# Patient Record
Sex: Female | Born: 1990 | Race: Asian | Hispanic: No | Marital: Married
Health system: Southern US, Community
[De-identification: ages and names within clinical notes are randomized; demographics above are authoritative.]

## PROBLEM LIST (undated history)

## (undated) ENCOUNTER — Inpatient Hospital Stay (HOSPITAL_COMMUNITY): Payer: Self-pay

## (undated) DIAGNOSIS — E559 Vitamin D deficiency, unspecified: Secondary | ICD-10-CM

## (undated) DIAGNOSIS — Z789 Other specified health status: Secondary | ICD-10-CM

## (undated) DIAGNOSIS — D509 Iron deficiency anemia, unspecified: Secondary | ICD-10-CM

## (undated) HISTORY — DX: Vitamin D deficiency, unspecified: E55.9

## (undated) HISTORY — PX: TONGUE SURGERY: SHX810

## (undated) HISTORY — DX: Iron deficiency anemia, unspecified: D50.9

---
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
## 2009-03-15 ENCOUNTER — Ambulatory Visit: Payer: Self-pay | Admitting: Obstetrics

## 2009-05-19 ENCOUNTER — Ambulatory Visit: Payer: Self-pay | Admitting: Obstetrics and Gynecology

## 2009-05-19 ENCOUNTER — Inpatient Hospital Stay (HOSPITAL_COMMUNITY): Admission: AD | Admit: 2009-05-19 | Discharge: 2009-05-19 | Payer: Self-pay | Admitting: Obstetrics & Gynecology

## 2009-06-08 ENCOUNTER — Inpatient Hospital Stay (HOSPITAL_COMMUNITY): Admission: AD | Admit: 2009-06-08 | Discharge: 2009-06-08 | Payer: Self-pay | Admitting: Obstetrics & Gynecology

## 2009-07-13 ENCOUNTER — Inpatient Hospital Stay (HOSPITAL_COMMUNITY): Admission: AD | Admit: 2009-07-13 | Discharge: 2009-07-13 | Payer: Self-pay | Admitting: Obstetrics & Gynecology

## 2009-07-14 ENCOUNTER — Inpatient Hospital Stay (HOSPITAL_COMMUNITY): Admission: AD | Admit: 2009-07-14 | Discharge: 2009-07-17 | Payer: Self-pay | Admitting: Obstetrics & Gynecology

## 2009-07-19 ENCOUNTER — Inpatient Hospital Stay (HOSPITAL_COMMUNITY): Admission: AD | Admit: 2009-07-19 | Discharge: 2009-07-19 | Payer: Self-pay | Admitting: Obstetrics

## 2010-01-25 ENCOUNTER — Emergency Department (HOSPITAL_COMMUNITY): Admission: EM | Admit: 2010-01-25 | Discharge: 2010-01-25 | Payer: Self-pay | Admitting: Emergency Medicine

## 2010-09-15 LAB — CBC
HCT: 32.9 % — ABNORMAL LOW (ref 36.0–46.0)
Hemoglobin: 10.9 g/dL — ABNORMAL LOW (ref 12.0–15.0)
MCHC: 33.2 g/dL (ref 30.0–36.0)
MCV: 86.5 fL (ref 78.0–100.0)
Platelets: 175 10*3/uL (ref 150–400)
RBC: 4.37 MIL/uL (ref 3.87–5.11)
WBC: 10.6 10*3/uL — ABNORMAL HIGH (ref 4.0–10.5)

## 2010-09-30 LAB — URINALYSIS, ROUTINE W REFLEX MICROSCOPIC
Bilirubin Urine: NEGATIVE
Glucose, UA: 100 mg/dL — AB
Hgb urine dipstick: NEGATIVE
Ketones, ur: NEGATIVE mg/dL
Specific Gravity, Urine: 1.01 (ref 1.005–1.030)

## 2010-09-30 LAB — WET PREP, GENITAL: Clue Cells Wet Prep HPF POC: NONE SEEN

## 2010-09-30 LAB — GC/CHLAMYDIA PROBE AMP, GENITAL
Chlamydia, DNA Probe: NEGATIVE
GC Probe Amp, Genital: NEGATIVE

## 2010-09-30 LAB — FETAL FIBRONECTIN: Fetal Fibronectin: NEGATIVE

## 2010-10-01 LAB — URINALYSIS, ROUTINE W REFLEX MICROSCOPIC
Bilirubin Urine: NEGATIVE
Glucose, UA: 500 mg/dL — AB
Hgb urine dipstick: NEGATIVE
Ketones, ur: NEGATIVE mg/dL
Nitrite: NEGATIVE
Protein, ur: NEGATIVE mg/dL
Specific Gravity, Urine: 1.02 (ref 1.005–1.030)
Urobilinogen, UA: 0.2 mg/dL (ref 0.0–1.0)
pH: 6 (ref 5.0–8.0)

## 2010-10-01 LAB — WET PREP, GENITAL
Clue Cells Wet Prep HPF POC: NONE SEEN
Trich, Wet Prep: NONE SEEN
Yeast Wet Prep HPF POC: NONE SEEN

## 2010-10-01 LAB — URINE MICROSCOPIC-ADD ON

## 2010-10-01 LAB — STREP B DNA PROBE: Strep Group B Ag: POSITIVE

## 2010-10-01 LAB — GC/CHLAMYDIA PROBE AMP, GENITAL: Chlamydia, DNA Probe: NEGATIVE

## 2015-04-19 ENCOUNTER — Inpatient Hospital Stay (HOSPITAL_COMMUNITY)
Admission: AD | Admit: 2015-04-19 | Discharge: 2015-04-19 | Disposition: A | Discharge status: Home / Self Care | Payer: BLUE CROSS/BLUE SHIELD | Source: Ambulatory Visit | Attending: Obstetrics and Gynecology | Admitting: Obstetrics and Gynecology

## 2015-04-19 ENCOUNTER — Encounter (HOSPITAL_COMMUNITY): Payer: Self-pay | Admitting: *Deleted

## 2015-04-19 DIAGNOSIS — O211 Hyperemesis gravidarum with metabolic disturbance: Secondary | ICD-10-CM | POA: Diagnosis present

## 2015-04-19 DIAGNOSIS — Z3A14 14 weeks gestation of pregnancy: Secondary | ICD-10-CM | POA: Diagnosis not present

## 2015-04-19 DIAGNOSIS — O219 Vomiting of pregnancy, unspecified: Secondary | ICD-10-CM

## 2015-04-19 DIAGNOSIS — E860 Dehydration: Secondary | ICD-10-CM

## 2015-04-19 HISTORY — DX: Other specified health status: Z78.9

## 2015-04-19 LAB — URINALYSIS, ROUTINE W REFLEX MICROSCOPIC
Bilirubin Urine: NEGATIVE
Glucose, UA: NEGATIVE mg/dL
Hgb urine dipstick: NEGATIVE
KETONES UR: 40 mg/dL — AB
NITRITE: NEGATIVE
PROTEIN: NEGATIVE mg/dL
Specific Gravity, Urine: >1.03 — ABNORMAL HIGH (ref 1.005–1.030)
UROBILINOGEN UA: 0.2 mg/dL (ref 0.0–1.0)
pH: 6 (ref 5.0–8.0)

## 2015-04-19 LAB — COMPREHENSIVE METABOLIC PANEL
ALK PHOS: 45 U/L (ref 38–126)
ALT: 11 U/L — AB (ref 14–54)
ANION GAP: 6 (ref 5–15)
AST: 17 U/L (ref 15–41)
Albumin: 3.5 g/dL (ref 3.5–5.0)
BUN: 6 mg/dL (ref 6–20)
CALCIUM: 8.8 mg/dL — AB (ref 8.9–10.3)
CHLORIDE: 105 mmol/L (ref 101–111)
CO2: 24 mmol/L (ref 22–32)
CREATININE: 0.44 mg/dL (ref 0.44–1.00)
Glucose, Bld: 88 mg/dL (ref 65–99)
Potassium: 3.3 mmol/L — ABNORMAL LOW (ref 3.5–5.1)
SODIUM: 135 mmol/L (ref 135–145)
Total Bilirubin: 0.4 mg/dL (ref 0.3–1.2)
Total Protein: 7.3 g/dL (ref 6.5–8.1)

## 2015-04-19 LAB — CBC
HCT: 28.3 % — ABNORMAL LOW (ref 36.0–46.0)
Hemoglobin: 9.6 g/dL — ABNORMAL LOW (ref 12.0–15.0)
MCH: 24.2 pg — AB (ref 26.0–34.0)
MCHC: 33.9 g/dL (ref 30.0–36.0)
MCV: 71.5 fL — AB (ref 78.0–100.0)
PLATELETS: 243 10*3/uL (ref 150–400)
RBC: 3.96 MIL/uL (ref 3.87–5.11)
RDW: 18.1 % — ABNORMAL HIGH (ref 11.5–15.5)
WBC: 7 10*3/uL (ref 4.0–10.5)

## 2015-04-19 LAB — URINE MICROSCOPIC-ADD ON

## 2015-04-19 MED ORDER — POTASSIUM CHLORIDE 10 MEQ/100ML IV SOLN
10.0000 meq | Freq: Once | INTRAVENOUS | Status: AC
  Administered 2015-04-19: 10 meq via INTRAVENOUS
  Filled 2015-04-19: qty 100

## 2015-04-19 MED ORDER — PROMETHAZINE HCL 25 MG/ML IJ SOLN
25.0000 mg | Freq: Once | INTRAMUSCULAR | Status: AC
  Administered 2015-04-19: 25 mg via INTRAVENOUS
  Filled 2015-04-19: qty 1

## 2015-04-19 MED ORDER — LACTATED RINGERS IV BOLUS (SEPSIS)
1000.0000 mL | Freq: Once | INTRAVENOUS | Status: AC
  Administered 2015-04-19: 1000 mL via INTRAVENOUS

## 2015-04-19 MED ORDER — DEXTROSE 5 % IN LACTATED RINGERS IV BOLUS
1000.0000 mL | Freq: Once | INTRAVENOUS | Status: AC
  Administered 2015-04-19: 1000 mL via INTRAVENOUS

## 2015-04-19 MED ORDER — PROMETHAZINE HCL 25 MG PO TABS: 12.5000 mg | ORAL_TABLET | Freq: Four times a day (QID) | ORAL | Status: DC | PRN

## 2015-04-19 NOTE — MAU Note (Signed)
Pt reports she has not been able to keep much down for over a week. On diclegis but not really helping.

## 2015-04-19 NOTE — MAU Provider Note (Signed)
Chief Complaint: Emesis During Pregnancy   First Provider Initiated Contact with Patient 04/19/15 1754      SUBJECTIVE HPI: Tina Gonzalez is a 24 y.o. G2P1001 at [redacted]w[redacted]d by LMP who presents to maternity admissions reporting nausea with vomiting 4-5x /day making her unable to keep down food/fluids. The nausea started several weeks ago but has been worse this week.   She denies vaginal bleeding, vaginal itching/burning, urinary symptoms, h/a, dizziness, or fever/chills.     Emesis  This is a recurrent problem. The current episode started 1 to 4 weeks ago. The problem occurs 5 to 10 times per day. The problem has been rapidly worsening. The emesis has an appearance of stomach contents. There has been no fever. Pertinent negatives include no abdominal pain, chest pain, chills, diarrhea, dizziness, fever or headaches. Treatments tried: Diclegis. The treatment provided no relief.    Past Medical History  Diagnosis Date  . Medical history non-contributory    History reviewed. No pertinent past surgical history. Social History   Social History  . Marital Status: Single    Spouse Name: N/A  . Number of Children: N/A  . Years of Education: N/A   Occupational History  . Not on file.   Social History Main Topics  . Smoking status: Never Smoker   . Smokeless tobacco: Not on file  . Alcohol Use: No  . Drug Use: No  . Sexual Activity: Not Currently   Other Topics Concern  . Not on file   Social History Narrative  . No narrative on file   No current facility-administered medications on file prior to encounter.   No current outpatient prescriptions on file prior to encounter.   No Known Allergies  ROS:  Review of Systems  Constitutional: Negative for fever, chills and fatigue.  HENT: Negative for sinus pressure.   Eyes: Negative for photophobia.  Respiratory: Negative for shortness of breath.   Cardiovascular: Negative for chest pain.  Gastrointestinal: Positive for vomiting.  Negative for nausea, abdominal pain, diarrhea and constipation.  Genitourinary: Negative for dysuria, frequency, flank pain, vaginal bleeding, vaginal discharge, difficulty urinating, vaginal pain and pelvic pain.  Musculoskeletal: Negative for neck pain.  Neurological: Negative for dizziness, weakness and headaches.  Psychiatric/Behavioral: Negative.      I have reviewed patient's Past Medical Hx, Surgical Hx, Family Hx, Social Hx, medications and allergies.   Physical Exam   Patient Vitals for the past 24 hrs:  BP Temp Temp src Pulse Resp Height Weight  04/19/15 1640 108/86 mmHg 99.3 F (37.4 C) Oral 99 18 - -  04/19/15 1509 116/72 mmHg 98.2 F (36.8 C) Oral 89 18  (1.651 m) 55.792 kg (123 lb)   Constitutional: Well-developed, well-nourished female in no acute distress.  Cardiovascular: normal rate Respiratory: normal effort GI: Abd soft, non-tender. Pos BS x 4 MS: Extremities nontender, no edema, normal ROM Neurologic: Alert and oriented x 4.  GU: Neg CVAT.   LAB RESULTS Results for orders placed or performed during the hospital encounter of 04/19/15 (from the past 24 hour(s))  Urinalysis, Routine w reflex microscopic (not at [facility])     Status: Abnormal   Collection Time: 04/19/15  3:05 PM  Result Value Ref Range   Color, Urine YELLOW YELLOW   APPearance CLEAR CLEAR   Specific Gravity, Urine >1.030 (H) 1.005 - 1.030   pH 6.0 5.0 - 8.0   Glucose, UA NEGATIVE NEGATIVE mg/dL   Hgb urine dipstick NEGATIVE NEGATIVE   Bilirubin Urine NEGATIVE NEGATIVE  Ketones, ur 40 (A) NEGATIVE mg/dL   Protein, ur NEGATIVE NEGATIVE mg/dL   Urobilinogen, UA 0.2 0.0 - 1.0 mg/dL   Nitrite NEGATIVE NEGATIVE   Leukocytes, UA TRACE (A) NEGATIVE  Urine microscopic-add on     Status: Abnormal   Collection Time: 04/19/15  3:05 PM  Result Value Ref Range   Squamous Epithelial / LPF FEW (A) RARE   WBC, UA 0-2 <3 WBC/hpf   RBC / HPF 0-2 <3 RBC/hpf   Bacteria, UA RARE RARE   Urine-Other  MUCOUS PRESENT   CBC     Status: Abnormal   Collection Time: 04/19/15  5:56 PM  Result Value Ref Range   WBC 7.0 4.0 - 10.5 K/uL   RBC 3.96 3.87 - 5.11 MIL/uL   Hemoglobin 9.6 (L) 12.0 - 15.0 g/dL   HCT 47.828.3 (L) 29.536.0 - 62.146.0 %   MCV 71.5 (L) 78.0 - 100.0 fL   MCH 24.2 (L) 26.0 - 34.0 pg   MCHC 33.9 30.0 - 36.0 g/dL   RDW 30.818.1 (H) 65.711.5 - 84.615.5 %   Platelets 243 150 - 400 K/uL  Comprehensive metabolic panel     Status: Abnormal   Collection Time: 04/19/15  5:56 PM  Result Value Ref Range   Sodium 135 135 - 145 mmol/L   Potassium 3.3 (L) 3.5 - 5.1 mmol/L   Chloride 105 101 - 111 mmol/L   CO2 24 22 - 32 mmol/L   Glucose, Bld 88 65 - 99 mg/dL   BUN 6 6 - 20 mg/dL   Creatinine, Ser 9.620.44 0.44 - 1.00 mg/dL   Calcium 8.8 (L) 8.9 - 10.3 mg/dL   Total Protein 7.3 6.5 - 8.1 g/dL   Albumin 3.5 3.5 - 5.0 g/dL   AST 17 15 - 41 U/L   ALT 11 (L) 14 - 54 U/L   Alkaline Phosphatase 45 38 - 126 U/L   Total Bilirubin 0.4 0.3 - 1.2 mg/dL   GFR calc non Af Amer >60 >60 mL/min   GFR calc Af Amer >60 >60 mL/min   Anion gap 6 5 - 15       MAU Management/MDM: Ordered labs and reviewed results.  Consult Dr Ambrose MantleHenley, reviewed assessment/labs.  Treatments in MAU included D5LR x 1000 ml, LR x 1000 ml, Phenergan 25 mg IV, and KCL x 10 meqs IV.  Pt reports significant improvement in nausea after fluids/Phenergan IV.  Pt stable at time of discharge.  ASSESSMENT 1. Nausea and vomiting during pregnancy prior to [redacted] weeks gestation   2. Mild dehydration     PLAN Discharge home Increase PO fluids Continue Diclegis daily Phenergan 12.5-25 mg PO or PV Q 6 hours  Follow-up Information    Follow up with Bing PlumeHENLEY,THOMAS F, MD.   Specialty:  Obstetrics and Gynecology   Why:  As scheduled   Contact information:   [address], [address] [place] [phone]       [facility] with [facility] [place] [facility].   Why:  As needed for emergencies   Contact  information:   [address] [id number] mc [place] [phone]      Sharen CounterLisa Leftwich-Kirby Certified Nurse-Midwife 04/19/2015  9:15 PM

## 2015-04-19 NOTE — Discharge Instructions (Signed)

## 2015-05-09 ENCOUNTER — Inpatient Hospital Stay (HOSPITAL_COMMUNITY)
Admission: EM | Admit: 2015-05-09 | Discharge: 2015-05-09 | Disposition: A | Discharge status: Home / Self Care | Payer: BLUE CROSS/BLUE SHIELD | Source: Ambulatory Visit | Attending: Obstetrics and Gynecology | Admitting: Obstetrics and Gynecology

## 2015-05-09 ENCOUNTER — Encounter (HOSPITAL_COMMUNITY): Payer: Self-pay

## 2015-05-09 DIAGNOSIS — Z3A17 17 weeks gestation of pregnancy: Secondary | ICD-10-CM | POA: Insufficient documentation

## 2015-05-09 DIAGNOSIS — O211 Hyperemesis gravidarum with metabolic disturbance: Secondary | ICD-10-CM | POA: Diagnosis present

## 2015-05-09 DIAGNOSIS — E860 Dehydration: Secondary | ICD-10-CM | POA: Insufficient documentation

## 2015-05-09 DIAGNOSIS — O210 Mild hyperemesis gravidarum: Secondary | ICD-10-CM | POA: Diagnosis not present

## 2015-05-09 LAB — CBC
HCT: 30.7 % — ABNORMAL LOW (ref 36.0–46.0)
HEMOGLOBIN: 10.2 g/dL — AB (ref 12.0–15.0)
MCH: 23.7 pg — AB (ref 26.0–34.0)
MCHC: 33.2 g/dL (ref 30.0–36.0)
MCV: 71.4 fL — ABNORMAL LOW (ref 78.0–100.0)
PLATELETS: 282 10*3/uL (ref 150–400)
RBC: 4.3 MIL/uL (ref 3.87–5.11)
RDW: 17.9 % — AB (ref 11.5–15.5)
WBC: 9.3 10*3/uL (ref 4.0–10.5)

## 2015-05-09 LAB — URINE MICROSCOPIC-ADD ON

## 2015-05-09 LAB — URINALYSIS, ROUTINE W REFLEX MICROSCOPIC
GLUCOSE, UA: NEGATIVE mg/dL
Hgb urine dipstick: NEGATIVE
Nitrite: NEGATIVE
PH: 6 (ref 5.0–8.0)
Protein, ur: 30 mg/dL — AB
Specific Gravity, Urine: >1.03 — ABNORMAL HIGH (ref 1.005–1.030)
Urobilinogen, UA: 0.2 mg/dL (ref 0.0–1.0)

## 2015-05-09 LAB — COMPREHENSIVE METABOLIC PANEL
ALBUMIN: 3.6 g/dL (ref 3.5–5.0)
ALK PHOS: 68 U/L (ref 38–126)
ALT: 13 U/L — ABNORMAL LOW (ref 14–54)
ANION GAP: 11 (ref 5–15)
AST: 19 U/L (ref 15–41)
BUN: 9 mg/dL (ref 6–20)
CALCIUM: 9.7 mg/dL (ref 8.9–10.3)
CHLORIDE: 104 mmol/L (ref 101–111)
CO2: 19 mmol/L — AB (ref 22–32)
Creatinine, Ser: 0.44 mg/dL (ref 0.44–1.00)
GFR calc non Af Amer: >60 mL/min (ref >60)
GLUCOSE: 104 mg/dL — AB (ref 65–99)
POTASSIUM: 3.6 mmol/L (ref 3.5–5.1)
SODIUM: 134 mmol/L — AB (ref 135–145)
Total Bilirubin: 0.5 mg/dL (ref 0.3–1.2)
Total Protein: 7.6 g/dL (ref 6.5–8.1)

## 2015-05-09 MED ORDER — LACTATED RINGERS IV BOLUS (SEPSIS)
1000.0000 mL | Freq: Once | INTRAVENOUS | Status: AC
  Administered 2015-05-09: 1000 mL via INTRAVENOUS

## 2015-05-09 MED ORDER — ONDANSETRON 4 MG PO TBDP: 8.0000 mg | ORAL_TABLET | Freq: Three times a day (TID) | ORAL | Status: DC | PRN

## 2015-05-09 MED ORDER — PROMETHAZINE HCL 25 MG/ML IJ SOLN
25.0000 mg | Freq: Once | INTRAVENOUS | Status: AC
  Administered 2015-05-09: 25 mg via INTRAVENOUS
  Filled 2015-05-09: qty 1

## 2015-05-09 MED ORDER — PROMETHAZINE HCL 25 MG PO TABS: 12.5000 mg | ORAL_TABLET | Freq: Four times a day (QID) | ORAL | Status: DC | PRN

## 2015-05-09 MED ORDER — PROMETHAZINE HCL 25 MG RE SUPP: 25.0000 mg | Freq: Four times a day (QID) | RECTAL | Status: DC | PRN

## 2015-05-09 MED ORDER — SODIUM CHLORIDE 0.9 % IV SOLN
8.0000 mg | Freq: Once | INTRAVENOUS | Status: DC
  Filled 2015-05-09: qty 4

## 2015-05-09 NOTE — MAU Provider Note (Signed)
Chief Complaint: Emesis During Pregnancy  First Provider Initiated Contact with Patient 05/09/15 1822      SUBJECTIVE HPI: Tina Gonzalez is a 24 y.o. G2P1001 at 2589w2d who presents to Maternity Admissions reporting exacerbation of nausea and vomiting of pregnancy. States she has manually keep anything down for 3 days. Has prescriptions for Zofran and Phenergan, but states the Zofran doesn't help her a much and the Phenergan is too sedating for her to use on a regular basis. Last doses of each were yesterday. She doesn't she Either of them down.   Weight 123 pounds on 04/19/2015.  Location: Mild groin pain that she attributes to frequent vomiting. Quality: Pulling sensation Severity: 2/10 on pain scale Duration: Greater than 1 week Context: With vomiting Timing: Intermittent Modifying factors: Worse with vomiting. Associated signs and symptoms: Negative for fever, chills, diarrhea, sick contacts. Positive for small streaks of blood in emesis.  Past Medical History  Diagnosis Date  . Medical history non-contributory    OB History  Gravida Para Term Preterm AB SAB TAB Ectopic Multiple Living  2 1 1       1     # Outcome Date GA Lbr Len/2nd Weight Sex Delivery Anes PTL Lv  2 Current           1 Term 07/15/09    M Vag-Spont   Y     No past surgical history on file. Social History   Social History  . Marital Status: Single    Spouse Name: N/A  . Number of Children: N/A  . Years of Education: N/A   Occupational History  . Not on file.   Social History Main Topics  . Smoking status: Never Smoker   . Smokeless tobacco: Not on file  . Alcohol Use: No  . Drug Use: No  . Sexual Activity: Not Currently   Other Topics Concern  . Not on file   Social History Narrative  . No narrative on file   No current facility-administered medications on file prior to encounter.   Current Outpatient Prescriptions on File Prior to Encounter  Medication Sig Dispense Refill  .  Doxylamine-Pyridoxine (DICLEGIS) 10-10 MG TBEC Take 1-2 tablets by mouth 3 (three) times daily as needed (for nausea and vomiting).    . Prenatal Vit-Min-FA-Fish Oil ([employer] PRENATAL GUMMY PO) Take 2 each by mouth daily.    . promethazine (PHENERGAN) 25 MG tablet Take 0.5-1 tablets (12.5-25 mg total) by mouth every 6 (six) hours as needed for nausea. 30 tablet 0   No Known Allergies  I have reviewed the past Medical Hx, Surgical Hx, Social Hx, Allergies and Medications.   Review of Systems  Constitutional: Negative for fever and chills.  HENT: Positive for congestion and rhinorrhea.   Gastrointestinal: Positive for nausea, vomiting, abdominal pain and constipation. Negative for diarrhea and blood in stool.  Genitourinary: Negative for dysuria, urgency, frequency, hematuria and vaginal bleeding.  Neurological: Positive for weakness.    OBJECTIVE Patient Vitals for the past 24 hrs:  BP Temp Temp src Pulse Resp Height Weight  05/09/15 1718 121/69 mmHg 98.5 F (36.9 C) Oral 113 16 5\' 5"  (1.651 m) 54.976 kg (121 lb 3.2 oz)   Constitutional: Well-developed, well-nourished female in no acute distress. Actively vomiting. Head: Mucous membranes dry. Lips pealing Cardiovascular: Tachycardia Respiratory: normal rate and effort.  GI: Abd soft, non-tender, gravid appropriate for gestational age.  Neurologic: Alert and oriented x 4.   LAB RESULTS Results for orders placed or performed  during the hospital encounter of 05/09/15 (from the past 24 hour(s))  Urinalysis, Routine w reflex microscopic (not at [facility])     Status: Abnormal   Collection Time: 05/09/15  5:25 PM  Result Value Ref Range   Color, Urine YELLOW YELLOW   APPearance CLEAR CLEAR   Specific Gravity, Urine >1.030 (H) 1.005 - 1.030   pH 6.0 5.0 - 8.0   Glucose, UA NEGATIVE NEGATIVE mg/dL   Hgb urine dipstick NEGATIVE NEGATIVE   Bilirubin Urine SMALL (A) NEGATIVE   Ketones, ur >80 (A) NEGATIVE mg/dL   Protein, ur 30 (A) NEGATIVE  mg/dL   Urobilinogen, UA 0.2 0.0 - 1.0 mg/dL   Nitrite NEGATIVE NEGATIVE   Leukocytes, UA TRACE (A) NEGATIVE  Urine microscopic-add on     Status: Abnormal   Collection Time: 05/09/15  5:25 PM  Result Value Ref Range   Squamous Epithelial / LPF FEW (A) RARE   WBC, UA 0-2 <3 WBC/hpf   RBC / HPF 0-2 <3 RBC/hpf   Urine-Other MUCOUS PRESENT   CBC     Status: Abnormal   Collection Time: 05/09/15  6:40 PM  Result Value Ref Range   WBC 9.3 4.0 - 10.5 K/uL   RBC 4.30 3.87 - 5.11 MIL/uL   Hemoglobin 10.2 (L) 12.0 - 15.0 g/dL   HCT 16.1 (L) 09.6 - 04.5 %   MCV 71.4 (L) 78.0 - 100.0 fL   MCH 23.7 (L) 26.0 - 34.0 pg   MCHC 33.2 30.0 - 36.0 g/dL   RDW 40.9 (H) 81.1 - 91.4 %   Platelets 282 150 - 400 K/uL  Comprehensive metabolic panel     Status: Abnormal   Collection Time: 05/09/15  6:40 PM  Result Value Ref Range   Sodium 134 (L) 135 - 145 mmol/L   Potassium 3.6 3.5 - 5.1 mmol/L   Chloride 104 101 - 111 mmol/L   CO2 19 (L) 22 - 32 mmol/L   Glucose, Bld 104 (H) 65 - 99 mg/dL   BUN 9 6 - 20 mg/dL   Creatinine, Ser 7.82 0.44 - 1.00 mg/dL   Calcium 9.7 8.9 - 95.6 mg/dL   Total Protein 7.6 6.5 - 8.1 g/dL   Albumin 3.6 3.5 - 5.0 g/dL   AST 19 15 - 41 U/L   ALT 13 (L) 14 - 54 U/L   Alkaline Phosphatase 68 38 - 126 U/L   Total Bilirubin 0.5 0.3 - 1.2 mg/dL   GFR calc non Af Amer >60 >60 mL/min   GFR calc Af Amer >60 >60 mL/min   Anion gap 11 5 - 15    IMAGING No results found.  MAU COURSE UA, LR bolus, Zofran, CBC, CMP.  Feeling a little bit better after Zofran. Phenergan infusion in 1 L of D5 LR ordered.  Tolerating PO's. Discussed Hx, Sx, UA, response to meds w/ Dr. Jackelyn Knife. Agrees w/ POC.   MDM 24 year old female at 35 weeks 2 days gestation with exacerbation of hyperemesis adequately controlled with Zofran and Phenergan. Dehydration treated with IV fluids. Electrolytes essentially normal.   ASSESSMENT 1. Hyperemesis gravidarum before end of [redacted] week gestation, dehydration     PLAN Discharge home in stable condition per consult with Dr Jackelyn Knife. Hyperemesis Precautions Encouraged pt to take meds on schedule. Increased Zofran to 8 mg dose and Rx phenergan suppositories as they may be less sedating. May use vaginally.    Follow-up Information    Follow up with MEISINGER,TODD D, MD.   Specialty:  Obstetrics and Gynecology   Why:  Routine prenatal visit   Contact information:   [address] [place] [address] [phone]       Follow up with [facility] [place] [facility].   Why:  As needed in emergencies   Contact information:   [address] [id number] mc [place] [address] [phone]        Medication List    TAKE these medications        acetaminophen 325 MG tablet  Commonly known as:  TYLENOL  Take 650 mg by mouth every 6 (six) hours as needed for headache.     [employer]S PRENATAL GUMMY PO  Take 2 each by mouth daily.     ondansetron 4 MG disintegrating tablet  Commonly known as:  ZOFRAN-ODT  Take 2 tablets (8 mg total) by mouth every 8 (eight) hours as needed for nausea or vomiting.     promethazine 25 MG tablet  Commonly known as:  PHENERGAN  Take 0.5-1 tablets (12.5-25 mg total) by mouth every 6 (six) hours as needed for nausea.     promethazine 25 MG suppository  Commonly known as:  PHENERGAN  Place 1 suppository (25 mg total) rectally every 6 (six) hours as needed for nausea.     STOOL SOFTENER PO  Take 1 tablet by mouth daily as needed (constipated).        [place], [place] 05/09/2015  5:38 PM

## 2015-05-09 NOTE — MAU Note (Signed)
Unable to hold down liquids or solids for the past 3 days.  Was seen in MAU several days ago, was given prescription for phenergan, is limited in when she can take this because of working & taking care of her son.  Has mild lower abd pain.  Denies bleeding.

## 2015-05-09 NOTE — Discharge Instructions (Signed)
Hyperemesis Gravidarum °Hyperemesis gravidarum is a severe form of nausea and vomiting that happens during pregnancy. Hyperemesis is worse than morning sickness. It may cause you to have nausea or vomiting all day for many days. It may keep you from eating and drinking enough food and liquids. Hyperemesis usually occurs during the first half (the first 20 weeks) of pregnancy. It often goes away once a woman is in her second half of pregnancy. However, sometimes hyperemesis continues through an entire pregnancy.  °CAUSES  °The cause of this condition is not completely known but is thought to be related to changes in the body's hormones when pregnant. It could be from the high level of the pregnancy hormone or an increase in estrogen in the body.  °SIGNS AND SYMPTOMS  °· Severe nausea and vomiting. °· Nausea that does not go away. °· Vomiting that does not allow you to keep any food down. °· Weight loss and body fluid loss (dehydration). °· Having no desire to eat or not liking food you have previously enjoyed. °DIAGNOSIS  °Your health care provider will do a physical exam and ask you about your symptoms. He or she may also order blood tests and urine tests to make sure something else is not causing the problem.  °TREATMENT  °You may only need medicine to control the problem. If medicines do not control the nausea and vomiting, you will be treated in the hospital to prevent dehydration, increased acid in the blood (acidosis), weight loss, and changes in the electrolytes in your body that may harm the unborn baby (fetus). You may need IV fluids.  °HOME CARE INSTRUCTIONS  °· Only take over-the-counter or prescription medicines as directed by your health care provider. °· Try eating a couple of dry crackers or toast in the morning before getting out of bed. °· Avoid foods and smells that upset your stomach. °· Avoid fatty and spicy foods. °· Eat 5-6 small meals a day. °· Do not drink when eating meals. Drink between  meals. °· For snacks, eat high-protein foods, such as cheese. °· Eat or suck on things that have ginger in them. Ginger helps nausea. °· Avoid food preparation. The smell of food can spoil your appetite. °· Avoid iron pills and iron in your multivitamins until after 3-4 months of being pregnant. However, consult with your health care provider before stopping any prescribed iron pills. °SEEK MEDICAL CARE IF:  °· Your abdominal pain increases. °· You have a severe headache. °· You have vision problems. °· You are losing weight. °SEEK IMMEDIATE MEDICAL CARE IF:  °· You are unable to keep fluids down. °· You vomit blood. °· You have constant nausea and vomiting. °· You have excessive weakness. °· You have extreme thirst. °· You have dizziness or fainting. °· You have a fever or persistent symptoms for more than 2-3 days. °· You have a fever and your symptoms suddenly get worse. °MAKE SURE YOU:  °· Understand these instructions. °· Will watch your condition. °· Will get help right away if you are not doing well or get worse. °  °This information is not intended to replace advice given to you by your health care provider. Make sure you discuss any questions you have with your health care provider. °  °Document Released: 06/15/2005 Document Revised: 04/05/2013 Document Reviewed: 01/25/2013 °Elsevier Interactive Patient Education ©2016 [employer] ° °Eating Plan for Hyperemesis Gravidarum °Severe cases of hyperemesis gravidarum can lead to dehydration and malnutrition. The hyperemesis eating plan is   one way to lessen the symptoms of nausea and vomiting. It is often used with prescribed medicines to control your symptoms.  °WHAT CAN I DO TO RELIEVE MY SYMPTOMS? °Listen to your body. Everyone is different and has different preferences. Find what works best for you. Some of the following things may help: °· Eat and drink slowly. °· Eat 5-6 small meals daily instead of 3 large meals.   °· Eat crackers before you get out of  bed in the morning.   °· Starchy foods are usually well tolerated (such as cereal, toast, bread, potatoes, pasta, rice, and pretzels).   °· Ginger may help with nausea. Add ¼ tsp ground ginger to hot tea or choose ginger tea.   °· Try drinking 100% fruit juice or an electrolyte drink. °· Continue to take your prenatal vitamins as directed by your health care provider. If you are having trouble taking your prenatal vitamins, talk with your health care provider about different options. °· Include at least 1 serving of protein with your meals and snacks (such as meats or poultry, beans, nuts, eggs, or yogurt). Try eating a protein-rich snack before bed (such as cheese and crackers or a half turkey or peanut butter sandwich). °WHAT THINGS SHOULD I AVOID TO REDUCE MY SYMPTOMS? °The following things may help reduce your symptoms: °· Avoid foods with strong smells. Try eating meals in well-ventilated areas that are free of odors. °· Avoid drinking water or other beverages with meals. Try not to drink anything less than 30 minutes before and after meals. °· Avoid drinking more than 1 cup of fluid at a time. °· Avoid fried or high-fat foods, such as butter and cream sauces. °· Avoid spicy foods. °· Avoid skipping meals the best you can. Nausea can be more intense on an empty stomach. If you cannot tolerate food at that time, do not force it. Try sucking on ice chips or other frozen items and make up the calories later. °· Avoid lying down within 2 hours after eating. °  °This information is not intended to replace advice given to you by your health care provider. Make sure you discuss any questions you have with your health care provider. °  °Document Released: 04/12/2007 Document Revised: 06/20/2013 Document Reviewed: 04/19/2013 °Elsevier Interactive Patient Education ©2016 [employer] Inc. ° ° °

## 2015-06-30 NOTE — L&D Delivery Note (Signed)
Delivery Note At 12:40 AM a viable female was delivered via Vaginal, Spontaneous Delivery (Presentation: Left Occiput Anterior).  APGAR: 9, 9; weight pending.   Placenta status: Intact, Spontaneous.  Cord:  with the following complications: none  Anesthesia: Epidural  Episiotomy: None Lacerations: 2nd degree Suture Repair: 3.0 vicryl rapide Est. Blood Loss (mL): 150  Mom to postpartum.  Baby to Couplet care / Skin to Skin.  Will do circumcision in the office  Zyniah Ferraiolo D 09/25/2015, 1:02 AM

## 2015-09-12 LAB — OB RESULTS CONSOLE GBS: GBS: POSITIVE

## 2015-09-22 ENCOUNTER — Inpatient Hospital Stay (HOSPITAL_COMMUNITY)
Admission: AD | Admit: 2015-09-22 | Discharge: 2015-09-22 | Disposition: A | Discharge status: Home / Self Care | Payer: BLUE CROSS/BLUE SHIELD | Source: Ambulatory Visit | Attending: Obstetrics and Gynecology | Admitting: Obstetrics and Gynecology

## 2015-09-22 ENCOUNTER — Encounter (HOSPITAL_COMMUNITY): Payer: Self-pay | Admitting: *Deleted

## 2015-09-22 DIAGNOSIS — Z3493 Encounter for supervision of normal pregnancy, unspecified, third trimester: Secondary | ICD-10-CM | POA: Insufficient documentation

## 2015-09-22 NOTE — Progress Notes (Signed)
Dr. Ellyn HackBovard notified of pt in MAU.  Notified that pt is a G2P1 at 457w5d.  Notified that pt is 2cm and 80.  Notified that contractions are irregular but pt describes them as very painful.  Provider states to send pt home and educate pt on labor precautions.

## 2015-09-22 NOTE — MAU Note (Signed)
Pt states the contractions have been going on for a few days.  Pt states the contractions became more regular yesterday.  Pt states they are about 7-8 minutes apart.  Pt states she is feeling the baby move.

## 2015-09-22 NOTE — Discharge Instructions (Signed)
Third Trimester of Pregnancy °The third trimester is from week 29 through week 42, months 7 through 9. The third trimester is a time when the fetus is growing rapidly. At the end of the ninth month, the fetus is about 20 inches in length and weighs 6-10 pounds.  °BODY CHANGES °Your body goes through many changes during pregnancy. The changes vary from woman to woman.  °· Your weight will continue to increase. You can expect to gain 25-35 pounds (11-16 kg) by the end of the pregnancy. °· You may begin to get stretch marks on your hips, abdomen, and breasts. °· You may urinate more often because the fetus is moving lower into your pelvis and pressing on your bladder. °· You may develop or continue to have heartburn as a result of your pregnancy. °· You may develop constipation because certain hormones are causing the muscles that push waste through your intestines to slow down. °· You may develop hemorrhoids or swollen, bulging veins (varicose veins). °· You may have pelvic pain because of the weight gain and pregnancy hormones relaxing your joints between the bones in your pelvis. Backaches may result from overexertion of the muscles supporting your posture. °· You may have changes in your hair. These can include thickening of your hair, rapid growth, and changes in texture. Some women also have hair loss during or after pregnancy, or hair that feels dry or thin. Your hair will most likely return to normal after your baby is born. °· Your breasts will continue to grow and be tender. A yellow discharge may leak from your breasts called colostrum. °· Your belly button may stick out. °· You may feel short of breath because of your expanding uterus. °· You may notice the fetus "dropping," or moving lower in your abdomen. °· You may have a bloody mucus discharge. This usually occurs a few days to a week before labor begins. °· Your cervix becomes thin and soft (effaced) near your due date. °WHAT TO EXPECT AT YOUR PRENATAL  EXAMS  °You will have prenatal exams every 2 weeks until week 36. Then, you will have weekly prenatal exams. During a routine prenatal visit: °· You will be weighed to make sure you and the fetus are growing normally. °· Your blood pressure is taken. °· Your abdomen will be measured to track your baby's growth. °· The fetal heartbeat will be listened to. °· Any test results from the previous visit will be discussed. °· You may have a cervical check near your due date to see if you have effaced. °At around 36 weeks, your caregiver will check your cervix. At the same time, your caregiver will also perform a test on the secretions of the vaginal tissue. This test is to determine if a type of bacteria, Group B streptococcus, is present. Your caregiver will explain this further. °Your caregiver may ask you: °· What your birth plan is. °· How you are feeling. °· If you are feeling the baby move. °· If you have had any abnormal symptoms, such as leaking fluid, bleeding, severe headaches, or abdominal cramping. °· If you are using any tobacco products, including cigarettes, chewing tobacco, and electronic cigarettes. °· If you have any questions. °Other tests or screenings that may be performed during your third trimester include: °· Blood tests that check for low iron levels (anemia). °· Fetal testing to check the health, activity level, and growth of the fetus. Testing is done if you have certain medical conditions or if   there are problems during the pregnancy. °· HIV (human immunodeficiency virus) testing. If you are at high risk, you may be screened for HIV during your third trimester of pregnancy. °FALSE LABOR °You may feel small, irregular contractions that eventually go away. These are called Braxton Hicks contractions, or false labor. Contractions may last for hours, days, or even weeks before true labor sets in. If contractions come at regular intervals, intensify, or become painful, it is best to be seen by your  caregiver.  °SIGNS OF LABOR  °· Menstrual-like cramps. °· Contractions that are 5 minutes apart or less. °· Contractions that start on the top of the uterus and spread down to the lower abdomen and back. °· A sense of increased pelvic pressure or back pain. °· A watery or bloody mucus discharge that comes from the vagina. °If you have any of these signs before the 37th week of pregnancy, call your caregiver right away. You need to go to the hospital to get checked immediately. °HOME CARE INSTRUCTIONS  °· Avoid all smoking, herbs, alcohol, and unprescribed drugs. These chemicals affect the formation and growth of the baby. °· Do not use any tobacco products, including cigarettes, chewing tobacco, and electronic cigarettes. If you need help quitting, ask your health care provider. You may receive counseling support and other resources to help you quit. °· Follow your caregiver's instructions regarding medicine use. There are medicines that are either safe or unsafe to take during pregnancy. °· Exercise only as directed by your caregiver. Experiencing uterine cramps is a good sign to stop exercising. °· Continue to eat regular, healthy meals. °· Wear a good support bra for breast tenderness. °· Do not use hot tubs, steam rooms, or saunas. °· Wear your seat belt at all times when driving. °· Avoid raw meat, uncooked cheese, cat litter boxes, and soil used by cats. These carry germs that can cause birth defects in the baby. °· Take your prenatal vitamins. °· Take 1500-2000 mg of calcium daily starting at the 20th week of pregnancy until you deliver your baby. °· Try taking a stool softener (if your caregiver approves) if you develop constipation. Eat more high-fiber foods, such as fresh vegetables or fruit and whole grains. Drink plenty of fluids to keep your urine clear or pale yellow. °· Take warm sitz baths to soothe any pain or discomfort caused by hemorrhoids. Use hemorrhoid cream if your caregiver approves. °· If  you develop varicose veins, wear support hose. Elevate your feet for 15 minutes, 3-4 times a day. Limit salt in your diet. °· Avoid heavy lifting, wear low heal shoes, and practice good posture. °· Rest a lot with your legs elevated if you have leg cramps or low back pain. °· Visit your dentist if you have not gone during your pregnancy. Use a soft toothbrush to brush your teeth and be gentle when you floss. °· A sexual relationship may be continued unless your caregiver directs you otherwise. °· Do not travel far distances unless it is absolutely necessary and only with the approval of your caregiver. °· Take prenatal classes to understand, practice, and ask questions about the labor and delivery. °· Make a trial run to the hospital. °· Pack your hospital bag. °· Prepare the baby's nursery. °· Continue to go to all your prenatal visits as directed by your caregiver. °SEEK MEDICAL CARE IF: °· You are unsure if you are in labor or if your water has broken. °· You have dizziness. °· You have   mild pelvic cramps, pelvic pressure, or nagging pain in your abdominal area. °· You have persistent nausea, vomiting, or diarrhea. °· You have a bad smelling vaginal discharge. °· You have pain with urination. °SEEK IMMEDIATE MEDICAL CARE IF:  °· You have a fever. °· You are leaking fluid from your vagina. °· You have spotting or bleeding from your vagina. °· You have severe abdominal cramping or pain. °· You have rapid weight loss or gain. °· You have shortness of breath with chest pain. °· You notice sudden or extreme swelling of your face, hands, ankles, feet, or legs. °· You have not felt your baby move in over an hour. °· You have severe headaches that do not go away with medicine. °· You have vision changes. °  °This information is not intended to replace advice given to you by your health care provider. Make sure you discuss any questions you have with your health care provider. °  °Document Released: 06/09/2001 Document  Revised: 07/06/2014 Document Reviewed: 08/16/2012 °[employer] Interactive Patient Education ©2016 [employer] °Fetal Movement Counts °Patient Name: __________________________________________________ Patient Due Date: ____________________ °Performing a fetal movement count is highly recommended in high-risk pregnancies, but it is good for every pregnant woman to do. Your health care provider may ask you to start counting fetal movements at 28 weeks of the pregnancy. Fetal movements often increase: °· After eating a full meal. °· After physical activity. °· After eating or drinking something sweet or cold. °· At rest. °Pay attention to when you feel the baby is most active. This will help you notice a pattern of your baby's sleep and wake cycles and what factors contribute to an increase in fetal movement. It is important to perform a fetal movement count at the same time each day when your baby is normally most active.  °HOW TO COUNT FETAL MOVEMENTS °· Find a quiet and comfortable area to sit or lie down on your left side. Lying on your left side provides the best blood and oxygen circulation to your baby. °· Write down the day and time on a sheet of paper or in a journal. °· Start counting kicks, flutters, swishes, rolls, or jabs in a 2-hour period. You should feel at least 10 movements within 2 hours. °· If you do not feel 10 movements in 2 hours, wait 2-3 hours and count again. Look for a change in the pattern or not enough counts in 2 hours. °SEEK MEDICAL CARE IF: °· You feel less than 10 counts in 2 hours, tried twice. °· There is no movement in over an hour. °· The pattern is changing or taking longer each day to reach 10 counts in 2 hours. °· You feel the baby is not moving as he or she usually does. °Date: ____________ Movements: ____________ Start time: ____________ Finish time: ____________  °Date: ____________ Movements: ____________ Start time: ____________ Finish time: ____________ °Date: ____________  Movements: ____________ Start time: ____________ Finish time: ____________ °Date: ____________ Movements: ____________ Start time: ____________ Finish time: ____________ °Date: ____________ Movements: ____________ Start time: ____________ Finish time: ____________ °Date: ____________ Movements: ____________ Start time: ____________ Finish time: ____________ °Date: ____________ Movements: ____________ Start time: ____________ Finish time: ____________ °Date: ____________ Movements: ____________ Start time: ____________ Finish time: ____________  °Date: ____________ Movements: ____________ Start time: ____________ Finish time: ____________ °Date: ____________ Movements: ____________ Start time: ____________ Finish time: ____________ °Date: ____________ Movements: ____________ Start time: ____________ Finish time: ____________ °Date: ____________ Movements: ____________ Start time: ____________ Finish time: ____________ °Date:   ____________ Movements: ____________ Start time: ____________ Finish time: ____________ °Date: ____________ Movements: ____________ Start time: ____________ Finish time: ____________ °Date: ____________ Movements: ____________ Start time: ____________ Finish time: ____________  °Date: ____________ Movements: ____________ Start time: ____________ Finish time: ____________ °Date: ____________ Movements: ____________ Start time: ____________ Finish time: ____________ °Date: ____________ Movements: ____________ Start time: ____________ Finish time: ____________ °Date: ____________ Movements: ____________ Start time: ____________ Finish time: ____________ °Date: ____________ Movements: ____________ Start time: ____________ Finish time: ____________ °Date: ____________ Movements: ____________ Start time: ____________ Finish time: ____________ °Date: ____________ Movements: ____________ Start time: ____________ Finish time: ____________  °Date: ____________ Movements: ____________ Start time:  ____________ Finish time: ____________ °Date: ____________ Movements: ____________ Start time: ____________ Finish time: ____________ °Date: ____________ Movements: ____________ Start time: ____________ Finish time: ____________ °Date: ____________ Movements: ____________ Start time: ____________ Finish time: ____________ °Date: ____________ Movements: ____________ Start time: ____________ Finish time: ____________ °Date: ____________ Movements: ____________ Start time: ____________ Finish time: ____________ °Date: ____________ Movements: ____________ Start time: ____________ Finish time: ____________  °Date: ____________ Movements: ____________ Start time: ____________ Finish time: ____________ °Date: ____________ Movements: ____________ Start time: ____________ Finish time: ____________ °Date: ____________ Movements: ____________ Start time: ____________ Finish time: ____________ °Date: ____________ Movements: ____________ Start time: ____________ Finish time: ____________ °Date: ____________ Movements: ____________ Start time: ____________ Finish time: ____________ °Date: ____________ Movements: ____________ Start time: ____________ Finish time: ____________ °Date: ____________ Movements: ____________ Start time: ____________ Finish time: ____________  °Date: ____________ Movements: ____________ Start time: ____________ Finish time: ____________ °Date: ____________ Movements: ____________ Start time: ____________ Finish time: ____________ °Date: ____________ Movements: ____________ Start time: ____________ Finish time: ____________ °Date: ____________ Movements: ____________ Start time: ____________ Finish time: ____________ °Date: ____________ Movements: ____________ Start time: ____________ Finish time: ____________ °Date: ____________ Movements: ____________ Start time: ____________ Finish time: ____________ °Date: ____________ Movements: ____________ Start time: ____________ Finish time: ____________  °Date:  ____________ Movements: ____________ Start time: ____________ Finish time: ____________ °Date: ____________ Movements: ____________ Start time: ____________ Finish time: ____________ °Date: ____________ Movements: ____________ Start time: ____________ Finish time: ____________ °Date: ____________ Movements: ____________ Start time: ____________ Finish time: ____________ °Date: ____________ Movements: ____________ Start time: ____________ Finish time: ____________ °Date: ____________ Movements: ____________ Start time: ____________ Finish time: ____________ °Date: ____________ Movements: ____________ Start time: ____________ Finish time: ____________  °Date: ____________ Movements: ____________ Start time: ____________ Finish time: ____________ °Date: ____________ Movements: ____________ Start time: ____________ Finish time: ____________ °Date: ____________ Movements: ____________ Start time: ____________ Finish time: ____________ °Date: ____________ Movements: ____________ Start time: ____________ Finish time: ____________ °Date: ____________ Movements: ____________ Start time: ____________ Finish time: ____________ °Date: ____________ Movements: ____________ Start time: ____________ Finish time: ____________ °  °This information is not intended to replace advice given to you by your health care provider. Make sure you discuss any questions you have with your health care provider. °  °Document Released: 07/15/2006 Document Revised: 07/06/2014 Document Reviewed: 04/11/2012 °[employer] Interactive Patient Education ©2016 [employer] °Braxton Hicks Contractions °Contractions of the uterus can occur throughout pregnancy. Contractions are not always a sign that you are in labor.  °WHAT ARE BRAXTON HICKS CONTRACTIONS?  °Contractions that occur before labor are called Braxton Hicks contractions, or false labor. Toward the end of pregnancy (32-34 weeks), these contractions can develop more often and may become more  forceful. This is not true labor because these contractions do not result in opening (dilatation) and thinning of the cervix. They are sometimes difficult to tell apart from true labor because these contractions can be forceful and people have different pain tolerances. You should   not feel embarrassed if you go to the hospital with false labor. Sometimes, the only way to tell if you are in true labor is for your health care provider to look for changes in the cervix. °If there are no prenatal problems or other health problems associated with the pregnancy, it is completely safe to be sent home with false labor and await the onset of true labor. °HOW CAN YOU TELL THE DIFFERENCE BETWEEN TRUE AND FALSE LABOR? °False Labor °· The contractions of false labor are usually shorter and not as hard as those of true labor.   °· The contractions are usually irregular.   °· The contractions are often felt in the front of the lower abdomen and in the groin.   °· The contractions may go away when you walk around or change positions while lying down.   °· The contractions get weaker and are shorter lasting as time goes on.   °· The contractions do not usually become progressively stronger, regular, and closer together as with true labor.   °True Labor °· Contractions in true labor last 30-70 seconds, become very regular, usually become more intense, and increase in frequency.   °· The contractions do not go away with walking.   °· The discomfort is usually felt in the top of the uterus and spreads to the lower abdomen and low back.   °· True labor can be determined by your health care provider with an exam. This will show that the cervix is dilating and getting thinner.   °WHAT TO REMEMBER °· Keep up with your usual exercises and follow other instructions given by your health care provider.   °· Take medicines as directed by your health care provider.   °· Keep your regular prenatal appointments.   °· Eat and drink lightly if you  think you are going into labor.   °· If Braxton Hicks contractions are making you uncomfortable:   °· Change your position from lying down or resting to walking, or from walking to resting.   °· Sit and rest in a tub of warm water.   °· Drink 2-3 glasses of water. Dehydration may cause these contractions.   °· Do slow and deep breathing several times an hour.   °WHEN SHOULD I SEEK IMMEDIATE MEDICAL CARE? °Seek immediate medical care if: °· Your contractions become stronger, more regular, and closer together.   °· You have fluid leaking or gushing from your vagina.   °· You have a fever.   °· You pass blood-tinged mucus.   °· You have vaginal bleeding.   °· You have continuous abdominal pain.   °· You have low back pain that you never had before.   °· You feel your baby's head pushing down and causing pelvic pressure.   °· Your baby is not moving as much as it used to.   °  °This information is not intended to replace advice given to you by your health care provider. Make sure you discuss any questions you have with your health care provider. °  °Document Released: 06/15/2005 Document Revised: 06/20/2013 Document Reviewed: 03/27/2013 °[employer]vier Interactive Patient Education ©2016 [employer] ° °

## 2015-09-22 NOTE — Progress Notes (Signed)
Dr. Ellyn HackBovard called and since she was in a delivery the nurse said she would call back.

## 2015-09-24 ENCOUNTER — Inpatient Hospital Stay (HOSPITAL_COMMUNITY): Payer: BLUE CROSS/BLUE SHIELD

## 2015-09-24 ENCOUNTER — Inpatient Hospital Stay (HOSPITAL_COMMUNITY): Payer: BLUE CROSS/BLUE SHIELD | Admitting: Anesthesiology

## 2015-09-24 ENCOUNTER — Encounter (HOSPITAL_COMMUNITY): Payer: Self-pay | Admitting: *Deleted

## 2015-09-24 ENCOUNTER — Inpatient Hospital Stay (HOSPITAL_COMMUNITY)
Admission: AD | Admit: 2015-09-24 | Discharge: 2015-09-27 | DRG: 775 | Disposition: A | Discharge status: Home / Self Care | Payer: BLUE CROSS/BLUE SHIELD | Source: Ambulatory Visit | Attending: Obstetrics and Gynecology | Admitting: Obstetrics and Gynecology

## 2015-09-24 DIAGNOSIS — IMO0002 Reserved for concepts with insufficient information to code with codable children: Secondary | ICD-10-CM

## 2015-09-24 DIAGNOSIS — O4103X0 Oligohydramnios, third trimester, not applicable or unspecified: Secondary | ICD-10-CM | POA: Diagnosis present

## 2015-09-24 DIAGNOSIS — O99824 Streptococcus B carrier state complicating childbirth: Secondary | ICD-10-CM | POA: Diagnosis present

## 2015-09-24 DIAGNOSIS — O4100X0 Oligohydramnios, unspecified trimester, not applicable or unspecified: Secondary | ICD-10-CM | POA: Diagnosis present

## 2015-09-24 DIAGNOSIS — Z3A37 37 weeks gestation of pregnancy: Secondary | ICD-10-CM | POA: Diagnosis not present

## 2015-09-24 LAB — CBC
HCT: 29.7 % — ABNORMAL LOW (ref 36.0–46.0)
Hemoglobin: 10 g/dL — ABNORMAL LOW (ref 12.0–15.0)
MCH: 22.8 pg — AB (ref 26.0–34.0)
MCHC: 33.7 g/dL (ref 30.0–36.0)
MCV: 67.8 fL — AB (ref 78.0–100.0)
PLATELETS: 277 10*3/uL (ref 150–400)
RBC: 4.38 MIL/uL (ref 3.87–5.11)
RDW: 22 % — AB (ref 11.5–15.5)
WBC: 9.5 10*3/uL (ref 4.0–10.5)

## 2015-09-24 LAB — OB RESULTS CONSOLE ANTIBODY SCREEN: Antibody Screen: NEGATIVE

## 2015-09-24 LAB — OB RESULTS CONSOLE HEPATITIS B SURFACE ANTIGEN: Hepatitis B Surface Ag: NEGATIVE

## 2015-09-24 LAB — OB RESULTS CONSOLE ABO/RH: RH TYPE: POSITIVE

## 2015-09-24 LAB — OB RESULTS CONSOLE GC/CHLAMYDIA
Chlamydia: NEGATIVE
Gonorrhea: NEGATIVE

## 2015-09-24 LAB — ABO/RH: ABO/RH(D): A POS

## 2015-09-24 LAB — OB RESULTS CONSOLE HIV ANTIBODY (ROUTINE TESTING): HIV: NONREACTIVE

## 2015-09-24 LAB — TYPE AND SCREEN
ABO/RH(D): A POS
Antibody Screen: NEGATIVE

## 2015-09-24 LAB — OB RESULTS CONSOLE RPR: RPR: NONREACTIVE

## 2015-09-24 LAB — OB RESULTS CONSOLE RUBELLA ANTIBODY, IGM: RUBELLA: IMMUNE

## 2015-09-24 MED ORDER — LIDOCAINE HCL (PF) 1 % IJ SOLN
30.0000 mL | INTRAMUSCULAR | Status: DC | PRN
  Filled 2015-09-24: qty 30

## 2015-09-24 MED ORDER — LACTATED RINGERS IV SOLN: 500.0000 mL | Freq: Once | INTRAVENOUS | Status: DC

## 2015-09-24 MED ORDER — EPHEDRINE 5 MG/ML INJ: 10.0000 mg | INTRAVENOUS | Status: DC | PRN

## 2015-09-24 MED ORDER — ONDANSETRON HCL 4 MG/2ML IJ SOLN: 4.0000 mg | Freq: Four times a day (QID) | INTRAMUSCULAR | Status: DC | PRN

## 2015-09-24 MED ORDER — FENTANYL 2.5 MCG/ML BUPIVACAINE 1/10 % EPIDURAL INFUSION (WH - ANES)
14.0000 mL/h | INTRAMUSCULAR | Status: DC | PRN
  Administered 2015-09-24: 14 mL/h via EPIDURAL
  Filled 2015-09-24: qty 125

## 2015-09-24 MED ORDER — CITRIC ACID-SODIUM CITRATE 334-500 MG/5ML PO SOLN: 30.0000 mL | ORAL | Status: DC | PRN

## 2015-09-24 MED ORDER — LACTATED RINGERS IV SOLN
500.0000 mL | Freq: Once | INTRAVENOUS | Status: AC
  Administered 2015-09-24: 500 mL via INTRAVENOUS

## 2015-09-24 MED ORDER — LACTATED RINGERS IV SOLN
1.0000 m[IU]/min | INTRAVENOUS | Status: DC
  Administered 2015-09-24: 2 m[IU]/min via INTRAVENOUS
  Filled 2015-09-24: qty 10

## 2015-09-24 MED ORDER — DIPHENHYDRAMINE HCL 50 MG/ML IJ SOLN: 12.5000 mg | INTRAMUSCULAR | Status: DC | PRN

## 2015-09-24 MED ORDER — TERBUTALINE SULFATE 1 MG/ML IJ SOLN: 0.2500 mg | Freq: Once | INTRAMUSCULAR | Status: DC | PRN

## 2015-09-24 MED ORDER — LACTATED RINGERS IV SOLN
500.0000 mL | INTRAVENOUS | Status: DC | PRN
  Administered 2015-09-24: 500 mL via INTRAVENOUS

## 2015-09-24 MED ORDER — PENICILLIN G POTASSIUM 5000000 UNITS IJ SOLR
5.0000 10*6.[IU] | Freq: Once | INTRAMUSCULAR | Status: AC
  Administered 2015-09-24: 5 10*6.[IU] via INTRAVENOUS
  Filled 2015-09-24: qty 5

## 2015-09-24 MED ORDER — LIDOCAINE HCL (PF) 1 % IJ SOLN
INTRAMUSCULAR | Status: DC | PRN
  Administered 2015-09-24: 3 mL via EPIDURAL
  Administered 2015-09-24: 5 mL via EPIDURAL
  Administered 2015-09-24: 2 mL via EPIDURAL

## 2015-09-24 MED ORDER — BUTORPHANOL TARTRATE 1 MG/ML IJ SOLN
1.0000 mg | INTRAMUSCULAR | Status: DC | PRN
  Administered 2015-09-24: 1 mg via INTRAVENOUS
  Filled 2015-09-24: qty 1

## 2015-09-24 MED ORDER — DEXTROSE 5 % IV SOLN
2.5000 10*6.[IU] | INTRAVENOUS | Status: DC
  Administered 2015-09-24: 2.5 10*6.[IU] via INTRAVENOUS
  Filled 2015-09-24 (×5): qty 2.5

## 2015-09-24 MED ORDER — PHENYLEPHRINE 40 MCG/ML (10ML) SYRINGE FOR IV PUSH (FOR BLOOD PRESSURE SUPPORT): 80.0000 ug | PREFILLED_SYRINGE | INTRAVENOUS | Status: DC | PRN

## 2015-09-24 MED ORDER — OXYTOCIN 10 UNIT/ML IJ SOLN: 2.5000 [IU]/h | INTRAVENOUS | Status: DC

## 2015-09-24 MED ORDER — PHENYLEPHRINE 40 MCG/ML (10ML) SYRINGE FOR IV PUSH (FOR BLOOD PRESSURE SUPPORT)
80.0000 ug | PREFILLED_SYRINGE | INTRAVENOUS | Status: DC | PRN
  Filled 2015-09-24: qty 20

## 2015-09-24 MED ORDER — OXYTOCIN BOLUS FROM INFUSION: 500.0000 mL | INTRAVENOUS | Status: DC

## 2015-09-24 MED ORDER — LACTATED RINGERS IV SOLN
INTRAVENOUS | Status: DC
  Administered 2015-09-24 (×2): via INTRAVENOUS

## 2015-09-24 MED ORDER — OXYCODONE-ACETAMINOPHEN 5-325 MG PO TABS: 2.0000 | ORAL_TABLET | ORAL | Status: DC | PRN

## 2015-09-24 MED ORDER — OXYCODONE-ACETAMINOPHEN 5-325 MG PO TABS: 1.0000 | ORAL_TABLET | ORAL | Status: DC | PRN

## 2015-09-24 NOTE — Progress Notes (Signed)
MD called, is admitting pt, will put in orders.

## 2015-09-24 NOTE — MAU Note (Signed)
Urine in lab 

## 2015-09-24 NOTE — H&P (Signed)
Tina Gonzalez is a 25 y.o. female, G3 P1011, EGA [redacted] weeks with EDC 4-18 presenting for ctx.  On eval in MAU, irregular ctx, cervix not changed from previous exam.  FHT reactive but with occasional variable decel, so u/s ordered to check AFI.  Official AFI is 6, no 2x2 cm pocket so Dr. Judeth Gonzalez (MFM) recommended delivery.  Prenatal care uncomplicated.  Maternal Medical History:  Reason for admission: Contractions.   Contractions: Frequency: irregular.   Perceived severity is moderate.    Fetal activity: Perceived fetal activity is normal.    Prenatal complications: no prenatal complications   OB History    Gravida Para Term Preterm AB TAB SAB Ectopic Multiple Living   2 1 1       1     SVD without comps  Past Medical History  Diagnosis Date  . Medical history non-contributory    Past Surgical History  Procedure Laterality Date  . Tongue surgery     Family History: family history is negative for Hearing loss. Social History:  reports that she has never smoked. She has never used smokeless tobacco. She reports that she does not drink alcohol or use illicit drugs.   Prenatal Transfer Tool  Maternal Diabetes: No Genetic Screening: Abnormal:  Results: Elevated AFP Maternal Ultrasounds/Referrals: Normal Fetal Ultrasounds or other Referrals:  None Maternal Substance Abuse:  No Significant Maternal Medications:  None Significant Maternal Lab Results:  Lab values include: Group B Strep positive Other Comments:  None  Review of Systems  Respiratory: Negative.   Cardiovascular: Negative.     Dilation: 2 Effacement (%): 90 Station: -3 Exam by:: Tina GermanJ. Lowe RN Blood pressure 112/71, pulse 95, temperature 98.5 F (36.9 C), temperature source Oral, resp. rate 16. Maternal Exam:  Uterine Assessment: Contraction strength is mild.  Contraction frequency is irregular.   Abdomen: Patient reports no abdominal tenderness. Estimated fetal weight is 6 1/2 lbs.   Fetal presentation:  vertex  Introitus: Normal vulva. Normal vagina.  Amniotic fluid character: not assessed.  Pelvis: adequate for delivery.   Cervix: Cervix evaluated by digital exam.     Fetal Exam Fetal Monitor Review: Mode: ultrasound.   Baseline rate: 130.  Variability: moderate (6-25 bpm).   Pattern: accelerations present and variable decelerations.    Fetal State Assessment: Category II - tracings are indeterminate.     Physical Exam  Vitals reviewed. Constitutional: She appears well-developed and well-nourished.  Cardiovascular: Normal rate, regular rhythm and normal heart sounds.   No murmur heard. Respiratory: Effort normal and breath sounds normal. No respiratory distress. She has no wheezes.  GI: Soft.    Prenatal labs: ABO, Rh:  A pos Antibody:  neg Rubella:  Immune RPR:   NR HBsAg:  Neg  HIV:   NR GBS: Positive (03/16 0000)   Assessment/Plan: IUP at 37 weeks with oligohydramnios and irregular ctx.  Will admit and augment with pitocin, monitor progress and FHT, anticipate SVD.  Start PCN for +GBS.   [name] D 09/24/2015, 5:01 PM

## 2015-09-24 NOTE — MAU Note (Signed)
Has been contracting last few days.  Now every 8 min. No bleeding or ROM.  Was 2 cm when last checked.

## 2015-09-24 NOTE — Progress Notes (Signed)
MD informed of SVE & variable decels, states he will review the tracing & call me back.

## 2015-09-24 NOTE — Anesthesia Preprocedure Evaluation (Addendum)
Anesthesia Evaluation  Patient identified by MRN, date of birth, ID band Patient awake    Reviewed: Allergy & Precautions, NPO status , Patient's Chart, lab work & pertinent test results  Airway Mallampati: II  TM Distance: >3 FB Neck ROM: Full    Dental  (+) Teeth Intact, Dental Advisory Given   Pulmonary neg pulmonary ROS,    Pulmonary exam normal breath sounds clear to auscultation       Cardiovascular Exercise Tolerance: Good negative cardio ROS Normal cardiovascular exam Rhythm:Regular Rate:Normal     Neuro/Psych negative neurological ROS     GI/Hepatic negative GI ROS, Neg liver ROS,   Endo/Other  negative endocrine ROS  Renal/GU negative Renal ROS     Musculoskeletal negative musculoskeletal ROS (+)   Abdominal   Peds  Hematology  (+) Blood dyscrasia, anemia , Plt 277k   Anesthesia Other Findings Day of surgery medications reviewed with the patient.  Reproductive/Obstetrics (+) Pregnancy                             Anesthesia Physical Anesthesia Plan  ASA: II  Anesthesia Plan: Epidural   Post-op Pain Management:    Induction:   Airway Management Planned:   Additional Equipment:   Intra-op Plan:   Post-operative Plan:   Informed Consent: I have reviewed the patients History and Physical, chart, labs and discussed the procedure including the risks, benefits and alternatives for the proposed anesthesia with the patient or authorized representative who has indicated his/her understanding and acceptance.   Dental advisory given  Plan Discussed with:   Anesthesia Plan Comments: (Patient identified. Risks/Benefits/Options discussed with patient including but not limited to bleeding, infection, nerve damage, paralysis, failed block, incomplete pain control, headache, blood pressure changes, nausea, vomiting, reactions to medication both or allergic, itching and postpartum  back pain. Confirmed with bedside nurse the patient's most recent platelet count. Confirmed with patient that they are not currently taking any anticoagulation, have any bleeding history or any family history of bleeding disorders. Patient expressed understanding and wished to proceed. All questions were answered. )        Anesthesia Quick Evaluation

## 2015-09-24 NOTE — Anesthesia Procedure Notes (Signed)
Epidural Patient location during procedure: OB  Staffing Anesthesiologist: TURK, STEPHEN EDWARD Performed by: anesthesiologist   Preanesthetic Checklist Completed: patient identified, pre-op evaluation, timeout performed, IV checked, risks and benefits discussed and monitors and equipment checked  Epidural Patient position: sitting Prep: DuraPrep Patient monitoring: blood pressure and continuous pulse ox Approach: midline Location: L3-L4 Injection technique: LOR air  Needle:  Needle type: Tuohy  Needle gauge: 17 G Needle length: 9 cm Needle insertion depth: 3 cm Catheter size: 19 Gauge Catheter at skin depth: 8 cm Test dose: negative and Other (1% Lidocaine)  Additional Notes Patient identified.  Risk benefits discussed including failed block, incomplete pain control, headache, nerve damage, paralysis, blood pressure changes, nausea, vomiting, reactions to medication both toxic or allergic, and postpartum back pain.  Patient expressed understanding and wished to proceed.  All questions were answered.  Sterile technique used throughout procedure and epidural site dressed with sterile barrier dressing. No paresthesia or other complications noted. The patient did not experience any signs of intravascular injection such as tinnitus or metallic taste in mouth nor signs of intrathecal spread such as rapid motor block. Please see nursing notes for vital signs. Reason for block:procedure for pain   

## 2015-09-25 ENCOUNTER — Encounter (HOSPITAL_COMMUNITY): Payer: Self-pay | Admitting: *Deleted

## 2015-09-25 LAB — RPR: RPR: NONREACTIVE

## 2015-09-25 MED ORDER — BENZOCAINE-MENTHOL 20-0.5 % EX AERO
1.0000 "application " | INHALATION_SPRAY | CUTANEOUS | Status: DC | PRN
  Administered 2015-09-25: 1 via TOPICAL
  Filled 2015-09-25: qty 56

## 2015-09-25 MED ORDER — DIPHENHYDRAMINE HCL 25 MG PO CAPS: 25.0000 mg | ORAL_CAPSULE | Freq: Four times a day (QID) | ORAL | Status: DC | PRN

## 2015-09-25 MED ORDER — ACETAMINOPHEN 325 MG PO TABS: 650.0000 mg | ORAL_TABLET | ORAL | Status: DC | PRN

## 2015-09-25 MED ORDER — WITCH HAZEL-GLYCERIN EX PADS: 1.0000 "application " | MEDICATED_PAD | CUTANEOUS | Status: DC | PRN

## 2015-09-25 MED ORDER — LANOLIN HYDROUS EX OINT: TOPICAL_OINTMENT | CUTANEOUS | Status: DC | PRN

## 2015-09-25 MED ORDER — TETANUS-DIPHTH-ACELL PERTUSSIS 5-2.5-18.5 LF-MCG/0.5 IM SUSP: 0.5000 mL | Freq: Once | INTRAMUSCULAR | Status: DC

## 2015-09-25 MED ORDER — ONDANSETRON HCL 4 MG PO TABS: 4.0000 mg | ORAL_TABLET | ORAL | Status: DC | PRN

## 2015-09-25 MED ORDER — MAGNESIUM HYDROXIDE 400 MG/5ML PO SUSP: 30.0000 mL | ORAL | Status: DC | PRN

## 2015-09-25 MED ORDER — IBUPROFEN 600 MG PO TABS
600.0000 mg | ORAL_TABLET | Freq: Four times a day (QID) | ORAL | Status: DC
  Administered 2015-09-25 – 2015-09-27 (×8): 600 mg via ORAL
  Filled 2015-09-25 (×9): qty 1

## 2015-09-25 MED ORDER — SENNOSIDES-DOCUSATE SODIUM 8.6-50 MG PO TABS
2.0000 | ORAL_TABLET | ORAL | Status: DC
  Administered 2015-09-26: 2 via ORAL
  Filled 2015-09-25: qty 2

## 2015-09-25 MED ORDER — MEASLES, MUMPS & RUBELLA VAC ~~LOC~~ INJ
0.5000 mL | INJECTION | Freq: Once | SUBCUTANEOUS | Status: DC
  Filled 2015-09-25: qty 0.5

## 2015-09-25 MED ORDER — DIBUCAINE 1 % RE OINT: 1.0000 "application " | TOPICAL_OINTMENT | RECTAL | Status: DC | PRN

## 2015-09-25 MED ORDER — METHYLERGONOVINE MALEATE 0.2 MG PO TABS: 0.2000 mg | ORAL_TABLET | ORAL | Status: DC | PRN

## 2015-09-25 MED ORDER — ZOLPIDEM TARTRATE 5 MG PO TABS: 5.0000 mg | ORAL_TABLET | Freq: Every evening | ORAL | Status: DC | PRN

## 2015-09-25 MED ORDER — OXYCODONE HCL 5 MG PO TABS: 10.0000 mg | ORAL_TABLET | ORAL | Status: DC | PRN

## 2015-09-25 MED ORDER — PRENATAL MULTIVITAMIN CH
1.0000 | ORAL_TABLET | Freq: Every day | ORAL | Status: DC
  Administered 2015-09-25 – 2015-09-26 (×2): 1 via ORAL
  Filled 2015-09-25 (×2): qty 1

## 2015-09-25 MED ORDER — SIMETHICONE 80 MG PO CHEW: 80.0000 mg | CHEWABLE_TABLET | ORAL | Status: DC | PRN

## 2015-09-25 MED ORDER — OXYCODONE HCL 5 MG PO TABS: 5.0000 mg | ORAL_TABLET | ORAL | Status: DC | PRN

## 2015-09-25 MED ORDER — METHYLERGONOVINE MALEATE 0.2 MG/ML IJ SOLN: 0.2000 mg | INTRAMUSCULAR | Status: DC | PRN

## 2015-09-25 MED ORDER — ONDANSETRON HCL 4 MG/2ML IJ SOLN: 4.0000 mg | INTRAMUSCULAR | Status: DC | PRN

## 2015-09-25 NOTE — Anesthesia Postprocedure Evaluation (Signed)
Anesthesia Post Note  Patient: Tina Gonzalez  Procedure(s) Performed: * No procedures listed *  Patient location during evaluation: Mother Baby Anesthesia Type: Epidural Level of consciousness: awake and alert and oriented Pain management: pain level controlled Vital Signs Assessment: post-procedure vital signs reviewed and stable Respiratory status: spontaneous breathing Cardiovascular status: blood pressure returned to baseline Postop Assessment: no headache, no backache, patient able to bend at knees, no signs of nausea or vomiting and adequate PO intake Anesthetic complications: no    Last Vitals:  Filed Vitals:   09/25/15 0247 09/25/15 0350  BP: 112/70   Pulse: 83 83  Temp: 36.9 C 36.5 C  Resp: 18 18    Last Pain:  Filed Vitals:   09/25/15 0641  PainSc: 0-No pain                 Exilda Wilhite

## 2015-09-25 NOTE — Lactation Note (Addendum)
This note was copied from a baby's chart. Lactation Consultation Note  Patient Name: Tina Gonzalez ZOXWR'U Date: 09/25/2015 Reason for consult: Initial assessment;Infant < 6lbs;Late preterm infant   Initial Consult with Exp BF mom of 12 hour old infant born at 37w 1day GA weighing 4 lb 11.8 oz. Infant with 2 BF for 20 minutes, 3 attempts, 2-5cc EBM via spoon, 1 void, and 1 stool since birth.   Infant with low CBG's since birth that have responded to glucose gel and EBM supplementation. CBG will be rechecked at 1500.    Infant was awake and cueing to feed, he was latched to right breast using cross cradle hold and nursed for 15 minutes. He did require stimulation to maintain suckling at times, reviewed awakening techniques with parents and importance of nutritive suckling at the breast. Intermittent swallows were heard, mom did compress breast and massage breast with feeding. Infant was then cup fed 7 cc EBM that mom had pumped earlier. Assisted mom with positioning and pillow support and showed her how to cup feed infant, she was able to return cup feeding demo.  Discussed with mom that due to infant's GA and weight that he needs to be treated like a LPT infant. LPT infant handout given and discussed with parents. Discussed with mom that infant should BF 8-12 x in 24 hours at first feeding cues, he should be awakened at 3 hours if he is not cueing to feed. Infant needs to be supplemented with EBM every 3 hours. Mom and I discussed that if she is unable to pump enough supplement, infant will need to be supplemented with formula, mom agreeable if formula is necessary. Mom also agreeable to using bottle if deemed necessary.  Mom reports she has been shown to use pump and to hand express, encouraged her to hand express post pumping and discussed BM Storage guidelines with parents.   LC Brochure and BF Resources Handout given, informed of BF Support Groups, LC Phone # and IP/OP LC Services. Mom plans  to apply for [facility] after d/c. Mom does not have a pump at home, discussed 2 week rental while she is waiting on [facility] appt.    Plan is for mom to:  Attempt to BF infant 8-12 x in 24 hours at first feeding cues Limit BF to 15-20 minutes Supplement infant with EBM immediately after BF using amounts per LPT infant quidelines every 2-3 hours, can use bottle if needed Pump every 2-3 hour for 15-20 minutes on Initiate setting Hand express post pumping  Enc mom to call for assistance as needed with feedings. Mom voiced no questions at this time and voiced understanding to infant plan of care. Will follow up tomorrow and prn    Maternal Data Formula Feeding for Exclusion: No Has patient been taught Hand Expression?: Yes Does the patient have breastfeeding experience prior to this delivery?: Yes  Feeding Feeding Type: Breast Fed Length of feed: 15 min  LATCH Score/Interventions Latch: Grasps breast easily, tongue down, lips flanged, rhythmical sucking.  Audible Swallowing: A few with stimulation Intervention(s): Skin to skin;Hand expression;Alternate breast massage  Type of Nipple: Everted at rest and after stimulation  Comfort (Breast/Nipple): Soft / non-tender     Hold (Positioning): Assistance needed to correctly position infant at breast and maintain latch. Intervention(s): Breastfeeding basics reviewed;Support Pillows;Position options;Skin to skin  LATCH Score: 8  Lactation Tools Discussed/Used Tools: Feeding cup;Pump WIC Program:  (Plans to apply) Pump Review: Setup, frequency, and cleaning;Milk Storage  Consult Status Consult Status: Follow-up Date: 09/26/15 Follow-up type: In-patient    Silas FloodSharon S Ieisha Gao 09/25/2015, 2:33 PM

## 2015-09-25 NOTE — Progress Notes (Signed)
PPD #0 No problems, baby's CBG was 26 Afeb, VSS Fundus firm, NT at U-1 Continue routine postpartum care

## 2015-09-26 NOTE — Lactation Note (Signed)
This note was copied from a baby's chart. Lactation Consultation Note  Patient Name: Tina Gonzalez ZOXWR'UToday's Date: 09/26/2015 Reason for consult: Follow-up assessment;NICU baby;Infant < 6lbs   Follow up with mom of NICU Infant at 38 hours of age. Infant was transferred to NICU due to issues with Blood Glucose. Mom is BF infant in NICU and infant is being tube fed EBM. Mom is pumping and getting up to 12 cc /pumping. We reviewed pumping 8-12 x in 24 hours with a 4-5 hour stretch at night to rest. Mom reports pumping is going well. Gave mom Providing Milk for Your Baby in NICU and reviewed the differences in BM Storage. Mom has bottles and labels for BM. Enc mom to call with questions/concerns.    Maternal Data Formula Feeding for Exclusion: No Has patient been taught Hand Expression?: Yes Does the patient have breastfeeding experience prior to this delivery?: Yes  Feeding Feeding Type: Breast Milk Length of feed: 30 min  LATCH Score/Interventions Latch: Grasps breast easily, tongue down, lips flanged, rhythmical sucking.  Audible Swallowing: Spontaneous and intermittent Intervention(s): Skin to skin  Type of Nipple: Everted at rest and after stimulation  Comfort (Breast/Nipple): Soft / non-tender     Hold (Positioning): No assistance needed to correctly position infant at breast.  LATCH Score: 10  Lactation Tools Discussed/Used Pump Review: Setup, frequency, and cleaning;Milk Storage (Reviewed NICU storage guidelines)   Consult Status Consult Status: Follow-up Date: 09/27/15 Follow-up type: In-patient    Silas FloodSharon S Hice 09/26/2015, 3:33 PM

## 2015-09-26 NOTE — Progress Notes (Signed)
CSW acknowledges NICU admission.    Patient screened out for psychosocial assessment since none of the following apply:  Psychosocial stressors documented in mother or baby's chart  Gestation less than 32 weeks  Code at delivery   Infant with anomalies  Please contact the Clinical Social Worker if specific needs arise, or by MOB's request.       

## 2015-09-26 NOTE — Progress Notes (Signed)
PPD #1 No problems, baby in NICU for hypoglycemia Afeb, VSS Fundus firm, NT at U-1 Continue routine postpartum care

## 2015-09-27 MED ORDER — IBUPROFEN 600 MG PO TABS: 600.0000 mg | ORAL_TABLET | Freq: Four times a day (QID) | ORAL | Status: DC

## 2015-09-27 NOTE — Lactation Note (Signed)
This note was copied from a baby's chart. Lactation Consultation Note  Patient Name: Tina Gonzalez UJWJX'BToday's Date: 09/27/2015 Reason for consult: Follow-up assessment;NICU baby;Infant < 6lbs   Follow up with mom of 61 hour old NICU Infant. Mom reports she is pumping and milk is in. She reports infant is BF and is doing well with BF. Mom without questions at this time. [facility]IC [facility] Enc mom to call with questions/concerns/assistance with feeding as needed. Mom is aware there are pumps and pumping rooms available in NICU.   Maternal Data    Feeding Feeding Type: Breast Milk Nipple Type: Slow - flow Length of feed: 10 min  LATCH Score/Interventions Latch: Grasps breast easily, tongue down, lips flanged, rhythmical sucking.  Audible Swallowing: Spontaneous and intermittent Intervention(s): Skin to skin  Type of Nipple: Everted at rest and after stimulation  Comfort (Breast/Nipple): Soft / non-tender     Hold (Positioning): No assistance needed to correctly position infant at breast. Intervention(s): Support Pillows  LATCH Score: 10  Lactation Tools [facility]ed[facility], has an appointment on Tuesday [facility]/4) Pump Review: Setup, frequency, and cleaning   Consult Status Consult Status: PRN Follow-up type: Call as needed    Ed BlalockSharon S Hice 09/27/2015, 1:53 PM

## 2015-09-27 NOTE — Discharge Instructions (Signed)
As per discharge pamphlet °

## 2015-09-27 NOTE — Progress Notes (Signed)
Feeling some ctx Afeb, VSS FHT- Cat I VE-3-4/80/-1, vtx, AROM clear Continue pitocin, monitor progress

## 2015-09-27 NOTE — Discharge Summary (Signed)
OB Discharge Summary     Patient Name: Tina Gonzalez DOB: 1990/09/06 MRN: [id number]  Date of admission: 09/24/2015 Delivering MD: Lavina Hamman   Date of discharge: 09/27/2015  Admitting diagnosis: 37WKS, CTX Intrauterine pregnancy: [redacted]w[redacted]d     Secondary diagnosis:  Active Problems:   Oligohydramnios antepartum   SVD (spontaneous vaginal delivery)  Additional problems: oligohydramnios     Discharge diagnosis: Term Pregnancy Delivered and oligohydramnios                                                                                                Augmentation: Pitocin  Complications: None  Hospital course:  Onset of Labor With Vaginal Delivery     25 y.o. yo [id number] at [redacted]w[redacted]d was admitted in Latent Labor on 09/24/2015. Patient had an uncomplicated labor course as follows:  Membrane Rupture Time/Date: 12:26 AM ,09/25/2015   Intrapartum Procedures: Episiotomy: None [1]                                         Lacerations:  2nd degree [3]  Patient had a delivery of a Viable infant. 09/25/2015  Information for the patient's newborn:  Isadore, Bokhari [[id number]]  Delivery Method: Vaginal, Spontaneous Delivery (Filed from Delivery Summary)    Pateint had an uncomplicated postpartum course.  She is ambulating, tolerating a regular diet, passing flatus, and urinating well. Patient is discharged home in stable condition on 09/27/2015.    Physical exam  Filed Vitals:   09/25/15 1700 09/26/15 0713 09/26/15 1728 09/27/15 0607  BP: 106/64 101/64 112/82 107/67  Pulse: 89 96 85 84  Temp: 98.2 F (36.8 C) 98.2 F (36.8 C) 98.3 F (36.8 C) 98 F (36.7 C)  TempSrc: Oral Oral Axillary Oral  Resp: Height:      Weight:      SpO2:  100%     General: alert Lochia: appropriate Uterine Fundus: firm  Labs: Lab Results  Component Value Date   WBC 9.5 09/24/2015   HGB 10.0* 09/24/2015   HCT 29.7* 09/24/2015   MCV 67.8* 09/24/2015   PLT 277 09/24/2015   CMP  Latest Ref Rng 05/09/2015  Glucose 65 - 99 mg/dL 413(K)  BUN 6 - 20 mg/dL 9  Creatinine 4.40 - 1.02 mg/dL 7.25  Sodium 366 - 440 mmol/L 134(L)  Potassium 3.5 - 5.1 mmol/L 3.6  Chloride 101 - 111 mmol/L 104  CO2 22 - 32 mmol/L 19(L)  Calcium 8.9 - 10.3 mg/dL 9.7  Total Protein 6.5 - 8.1 g/dL 7.6  Total Bilirubin 0.3 - 1.2 mg/dL 0.5  Alkaline Phos 38 - 126 U/L 68  AST 15 - 41 U/L 19  ALT 14 - 54 U/L 13(L)    Discharge instruction: per After Visit Summary and "Baby and Me Booklet".  After visit meds:    Medication List    STOP taking these medications        ondansetron 4 MG disintegrating tablet  Commonly known as:  ZOFRAN-ODT     promethazine 25 MG suppository  Commonly known as:  PHENERGAN     promethazine 25 MG tablet  Commonly known as:  PHENERGAN      TAKE these medications        ibuprofen 600 MG tablet  Commonly known as:  ADVIL,MOTRIN  Take 1 tablet (600 mg total) by mouth every 6 (six) hours.        Diet: routine diet  Activity: Advance as tolerated. Pelvic rest for 6 weeks.   Outpatient follow up:6 weeks   Newborn Data: Live born female  Birth Weight: 4 lb 11.8 oz (2150 g) APGAR: 9, 9  Baby Feeding: Breast Disposition:NICU   09/27/2015 Zenaida NieceMEISINGER,Olivianna Higley D, MD

## 2015-09-27 NOTE — Progress Notes (Signed)
PPD #2 Doing well, baby stable in NICU Afeb, VSS D/c home 

## 2016-02-15 DIAGNOSIS — G44219 Episodic tension-type headache, not intractable: Secondary | ICD-10-CM | POA: Diagnosis not present

## 2016-10-10 DIAGNOSIS — Z0000 Encounter for general adult medical examination without abnormal findings: Secondary | ICD-10-CM | POA: Diagnosis not present

## 2016-10-10 DIAGNOSIS — E559 Vitamin D deficiency, unspecified: Secondary | ICD-10-CM | POA: Diagnosis not present

## 2016-10-10 DIAGNOSIS — R5383 Other fatigue: Secondary | ICD-10-CM | POA: Diagnosis not present

## 2017-03-30 DIAGNOSIS — H40033 Anatomical narrow angle, bilateral: Secondary | ICD-10-CM | POA: Diagnosis not present

## 2017-03-30 DIAGNOSIS — H04123 Dry eye syndrome of bilateral lacrimal glands: Secondary | ICD-10-CM | POA: Diagnosis not present

## 2017-08-02 DIAGNOSIS — H00021 Hordeolum internum right upper eyelid: Secondary | ICD-10-CM | POA: Diagnosis not present

## 2017-09-03 DIAGNOSIS — R51 Headache: Secondary | ICD-10-CM | POA: Diagnosis not present

## 2017-09-03 DIAGNOSIS — R079 Chest pain, unspecified: Secondary | ICD-10-CM | POA: Diagnosis not present

## 2017-09-22 DIAGNOSIS — H01021 Squamous blepharitis right upper eyelid: Secondary | ICD-10-CM | POA: Diagnosis not present

## 2017-09-22 DIAGNOSIS — H01022 Squamous blepharitis right lower eyelid: Secondary | ICD-10-CM | POA: Diagnosis not present

## 2017-09-22 DIAGNOSIS — H0011 Chalazion right upper eyelid: Secondary | ICD-10-CM | POA: Diagnosis not present

## 2017-09-22 DIAGNOSIS — H01024 Squamous blepharitis left upper eyelid: Secondary | ICD-10-CM | POA: Diagnosis not present

## 2018-02-22 DIAGNOSIS — Z136 Encounter for screening for cardiovascular disorders: Secondary | ICD-10-CM | POA: Diagnosis not present

## 2018-02-22 DIAGNOSIS — Z0000 Encounter for general adult medical examination without abnormal findings: Secondary | ICD-10-CM | POA: Diagnosis not present

## 2018-02-22 DIAGNOSIS — H00021 Hordeolum internum right upper eyelid: Secondary | ICD-10-CM | POA: Diagnosis not present

## 2018-02-22 DIAGNOSIS — R079 Chest pain, unspecified: Secondary | ICD-10-CM | POA: Diagnosis not present

## 2018-02-22 DIAGNOSIS — E010 Iodine-deficiency related diffuse (endemic) goiter: Secondary | ICD-10-CM | POA: Diagnosis not present

## 2018-02-22 DIAGNOSIS — Z30011 Encounter for initial prescription of contraceptive pills: Secondary | ICD-10-CM | POA: Diagnosis not present

## 2018-02-24 ENCOUNTER — Other Ambulatory Visit: Payer: Self-pay | Admitting: Family Medicine

## 2018-02-24 DIAGNOSIS — E010 Iodine-deficiency related diffuse (endemic) goiter: Secondary | ICD-10-CM

## 2018-03-03 ENCOUNTER — Ambulatory Visit
Admission: RE | Admit: 2018-03-03 | Discharge: 2018-03-03 | Disposition: A | Discharge status: Home / Self Care | Payer: BLUE CROSS/BLUE SHIELD | Source: Ambulatory Visit | Attending: Family Medicine | Admitting: Family Medicine

## 2018-03-03 DIAGNOSIS — E010 Iodine-deficiency related diffuse (endemic) goiter: Secondary | ICD-10-CM | POA: Diagnosis not present

## 2018-03-07 DIAGNOSIS — M21611 Bunion of right foot: Secondary | ICD-10-CM | POA: Diagnosis not present

## 2018-03-07 DIAGNOSIS — M2041 Other hammer toe(s) (acquired), right foot: Secondary | ICD-10-CM | POA: Diagnosis not present

## 2018-03-07 DIAGNOSIS — M21612 Bunion of left foot: Secondary | ICD-10-CM | POA: Diagnosis not present

## 2018-03-07 DIAGNOSIS — M2042 Other hammer toe(s) (acquired), left foot: Secondary | ICD-10-CM | POA: Diagnosis not present

## 2018-03-21 DIAGNOSIS — M2042 Other hammer toe(s) (acquired), left foot: Secondary | ICD-10-CM | POA: Diagnosis not present

## 2018-03-21 DIAGNOSIS — M21612 Bunion of left foot: Secondary | ICD-10-CM | POA: Diagnosis not present

## 2018-03-24 DIAGNOSIS — G8929 Other chronic pain: Secondary | ICD-10-CM | POA: Diagnosis not present

## 2018-03-24 DIAGNOSIS — Z01818 Encounter for other preprocedural examination: Secondary | ICD-10-CM | POA: Diagnosis not present

## 2018-03-25 DIAGNOSIS — M2041 Other hammer toe(s) (acquired), right foot: Secondary | ICD-10-CM | POA: Diagnosis not present

## 2018-03-25 DIAGNOSIS — M2011 Hallux valgus (acquired), right foot: Secondary | ICD-10-CM | POA: Diagnosis not present

## 2018-03-25 DIAGNOSIS — M21611 Bunion of right foot: Secondary | ICD-10-CM | POA: Diagnosis not present

## 2018-03-29 DIAGNOSIS — M21611 Bunion of right foot: Secondary | ICD-10-CM | POA: Diagnosis not present

## 2018-04-05 DIAGNOSIS — M21611 Bunion of right foot: Secondary | ICD-10-CM | POA: Diagnosis not present

## 2018-04-27 DIAGNOSIS — M21611 Bunion of right foot: Secondary | ICD-10-CM | POA: Diagnosis not present

## 2018-05-02 DIAGNOSIS — H40033 Anatomical narrow angle, bilateral: Secondary | ICD-10-CM | POA: Diagnosis not present

## 2018-05-02 DIAGNOSIS — H04123 Dry eye syndrome of bilateral lacrimal glands: Secondary | ICD-10-CM | POA: Diagnosis not present

## 2018-05-13 DIAGNOSIS — M21611 Bunion of right foot: Secondary | ICD-10-CM | POA: Diagnosis not present

## 2018-09-06 DIAGNOSIS — E010 Iodine-deficiency related diffuse (endemic) goiter: Secondary | ICD-10-CM | POA: Diagnosis not present

## 2018-09-06 DIAGNOSIS — H0011 Chalazion right upper eyelid: Secondary | ICD-10-CM | POA: Diagnosis not present

## 2018-09-06 DIAGNOSIS — H9201 Otalgia, right ear: Secondary | ICD-10-CM | POA: Diagnosis not present

## 2019-05-16 ENCOUNTER — Other Ambulatory Visit: Payer: Self-pay

## 2019-05-16 DIAGNOSIS — Z20822 Contact with and (suspected) exposure to covid-19: Secondary | ICD-10-CM

## 2019-05-17 LAB — NOVEL CORONAVIRUS, NAA: SARS-CoV-2, NAA: NOT DETECTED

## 2019-06-08 DIAGNOSIS — E010 Iodine-deficiency related diffuse (endemic) goiter: Secondary | ICD-10-CM | POA: Diagnosis not present

## 2019-06-08 DIAGNOSIS — Z0000 Encounter for general adult medical examination without abnormal findings: Secondary | ICD-10-CM | POA: Diagnosis not present

## 2019-06-14 DIAGNOSIS — E010 Iodine-deficiency related diffuse (endemic) goiter: Secondary | ICD-10-CM | POA: Diagnosis not present

## 2019-06-14 DIAGNOSIS — Z0000 Encounter for general adult medical examination without abnormal findings: Secondary | ICD-10-CM | POA: Diagnosis not present

## 2019-06-14 DIAGNOSIS — Z13220 Encounter for screening for lipoid disorders: Secondary | ICD-10-CM | POA: Diagnosis not present

## 2019-06-20 ENCOUNTER — Ambulatory Visit: Payer: HRSA Program | Attending: Internal Medicine

## 2019-06-20 DIAGNOSIS — U071 COVID-19: Secondary | ICD-10-CM | POA: Diagnosis not present

## 2019-06-20 DIAGNOSIS — Z20822 Contact with and (suspected) exposure to covid-19: Secondary | ICD-10-CM

## 2019-06-20 DIAGNOSIS — Z20828 Contact with and (suspected) exposure to other viral communicable diseases: Secondary | ICD-10-CM | POA: Diagnosis present

## 2019-06-21 DIAGNOSIS — Z9189 Other specified personal risk factors, not elsewhere classified: Secondary | ICD-10-CM | POA: Diagnosis not present

## 2019-06-21 DIAGNOSIS — U071 COVID-19: Secondary | ICD-10-CM | POA: Diagnosis not present

## 2019-06-22 LAB — NOVEL CORONAVIRUS, NAA: SARS-CoV-2, NAA: DETECTED — AB

## 2019-06-29 DIAGNOSIS — Z9189 Other specified personal risk factors, not elsewhere classified: Secondary | ICD-10-CM | POA: Diagnosis not present

## 2019-06-29 DIAGNOSIS — Z20828 Contact with and (suspected) exposure to other viral communicable diseases: Secondary | ICD-10-CM | POA: Diagnosis not present

## 2019-06-29 DIAGNOSIS — Z03818 Encounter for observation for suspected exposure to other biological agents ruled out: Secondary | ICD-10-CM | POA: Diagnosis not present

## 2019-09-23 DIAGNOSIS — H04123 Dry eye syndrome of bilateral lacrimal glands: Secondary | ICD-10-CM | POA: Diagnosis not present

## 2019-09-23 DIAGNOSIS — H40033 Anatomical narrow angle, bilateral: Secondary | ICD-10-CM | POA: Diagnosis not present

## 2019-10-10 DIAGNOSIS — Z20822 Contact with and (suspected) exposure to covid-19: Secondary | ICD-10-CM | POA: Diagnosis not present

## 2019-11-14 DIAGNOSIS — J069 Acute upper respiratory infection, unspecified: Secondary | ICD-10-CM | POA: Diagnosis not present

## 2019-11-14 DIAGNOSIS — Z03818 Encounter for observation for suspected exposure to other biological agents ruled out: Secondary | ICD-10-CM | POA: Diagnosis not present

## 2019-11-14 DIAGNOSIS — R05 Cough: Secondary | ICD-10-CM | POA: Diagnosis not present

## 2020-01-16 IMAGING — US US THYROID
1 series · 14 of 25 positions shown · non-contrast
Comparison: None.

CLINICAL DATA: Palpable abnormality. Thyromegaly on physical
examination

EXAM:
THYROID ULTRASOUND
TECHNIQUE: Ultrasound examination of the thyroid gland and adjacent soft
tissues was performed.

[Series 1: us thyroid · 0.08mm/px · 14 of 41 slices shown]
[im 1/41]
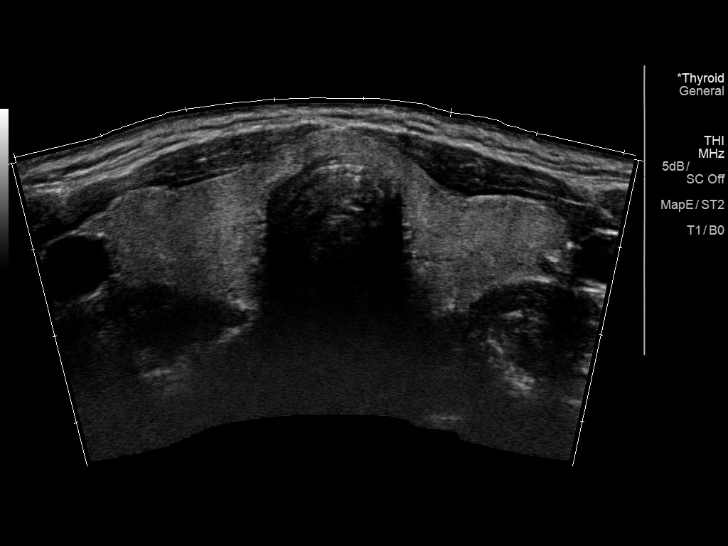
[im 4/41]
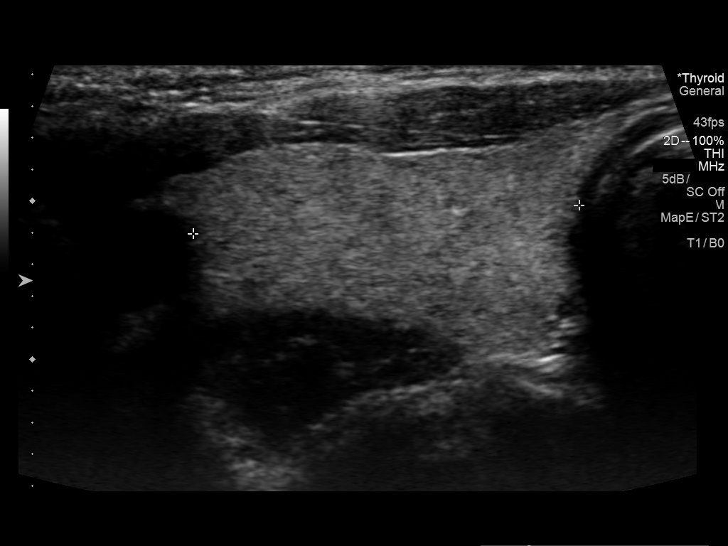
[im 7/41]
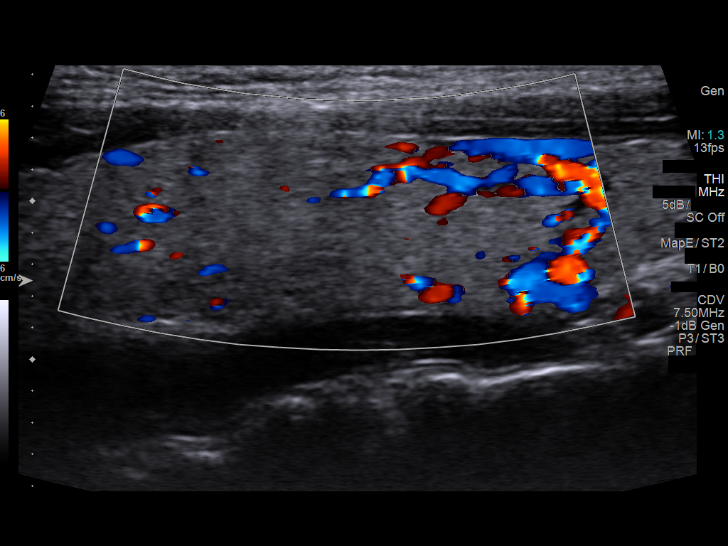
[im 11/41]
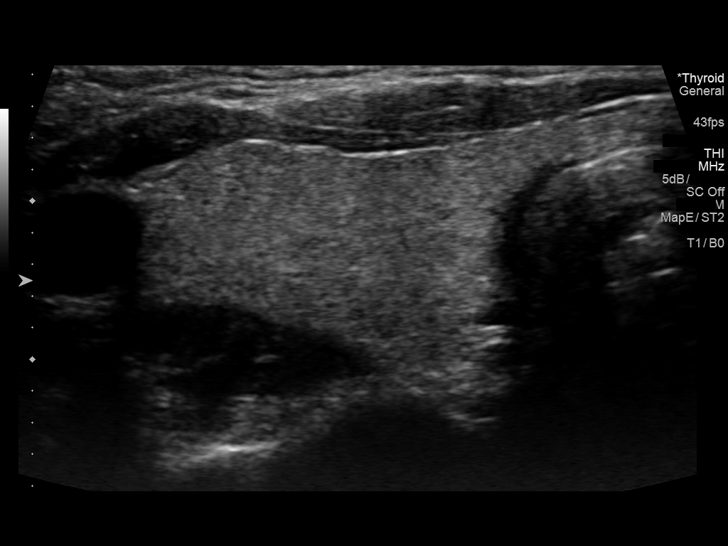
[im 14/41]
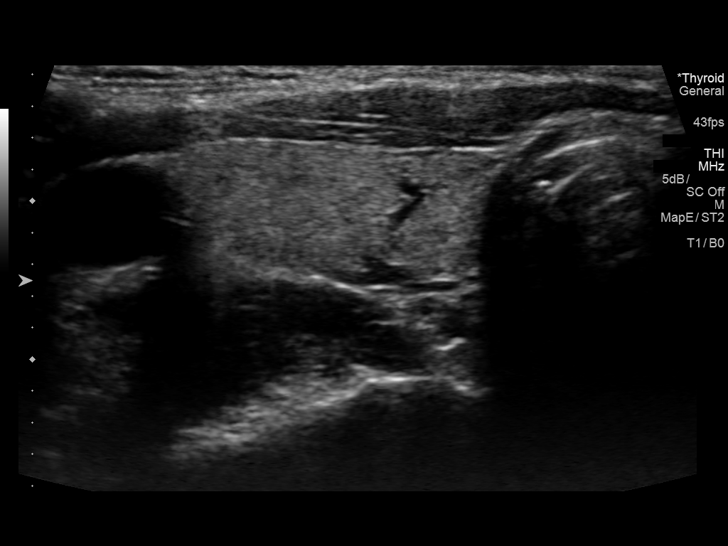
[im 16/41]
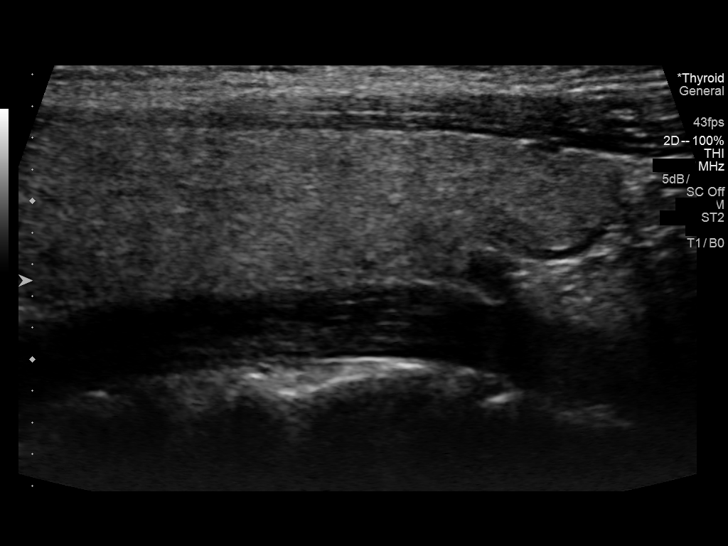
[im 19/41]
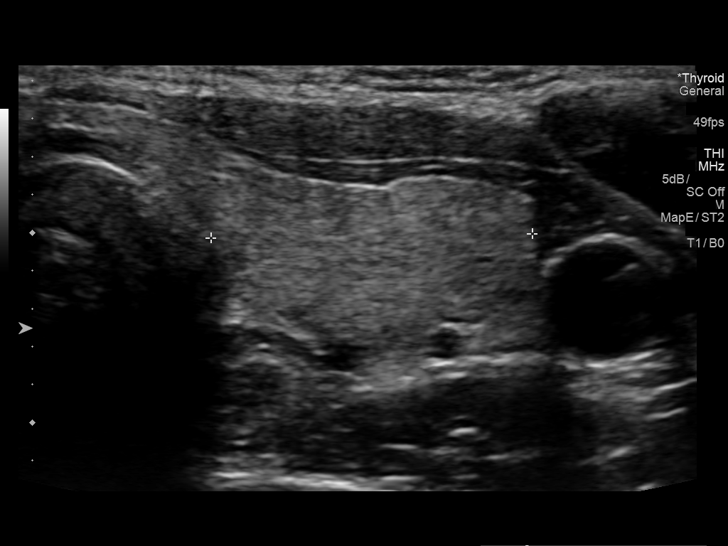
[im 22/41]
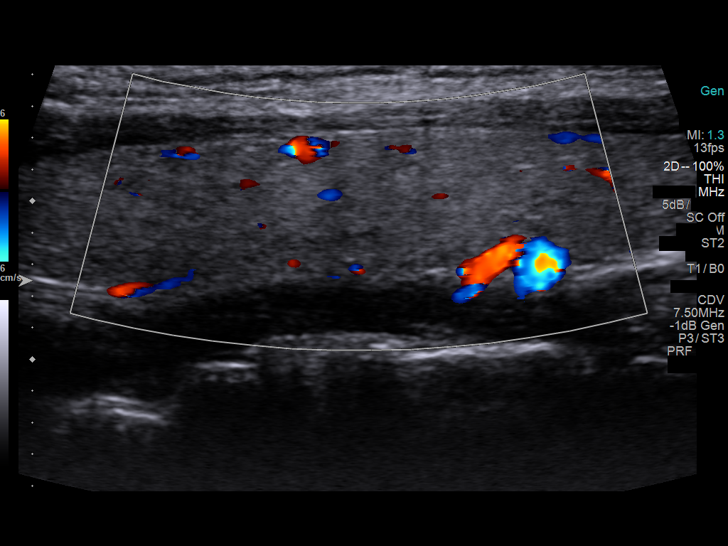
[im 26/41]
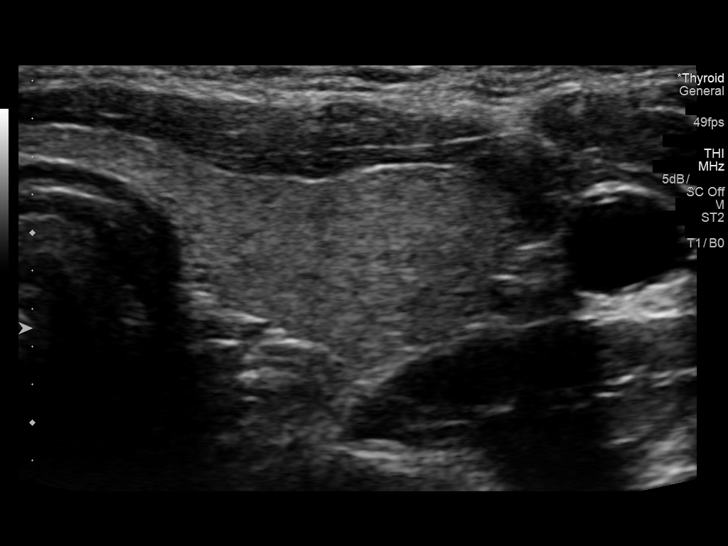
[im 27/41]
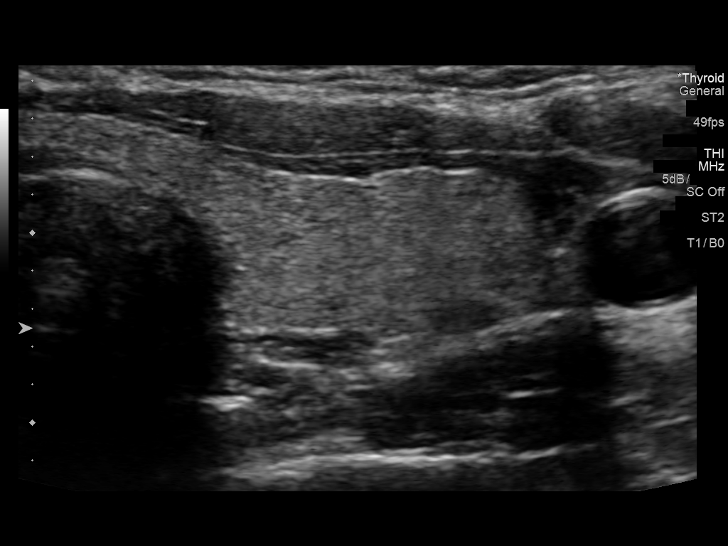
[im 31/41]
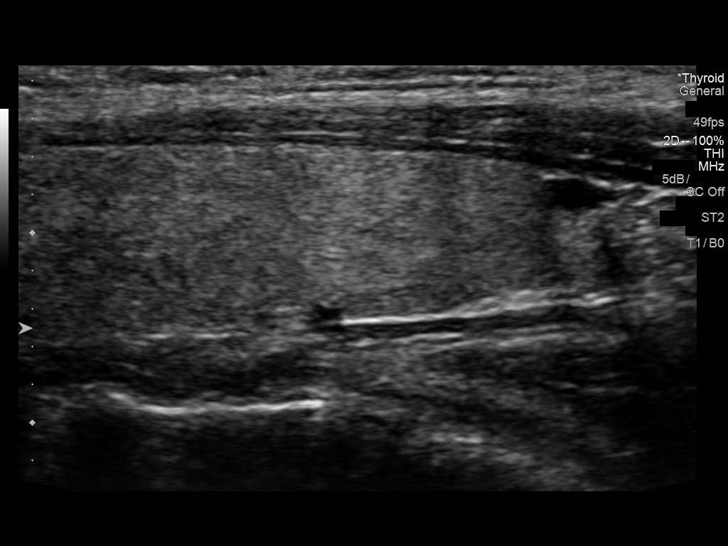
[im 34/41]
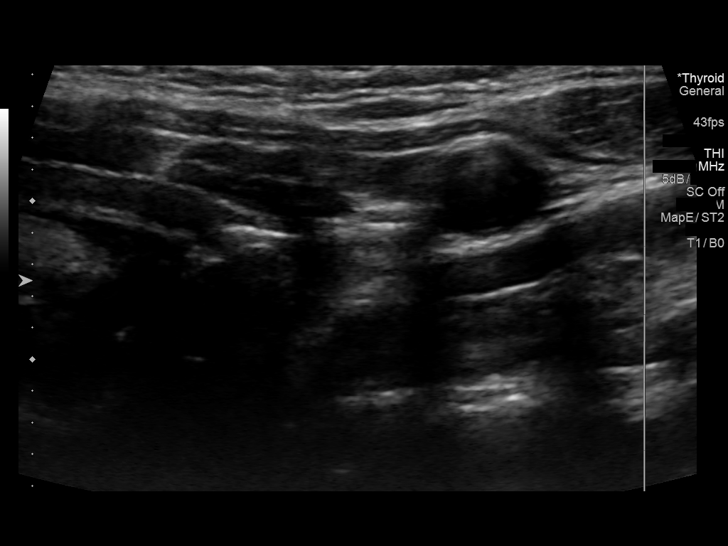
[im 37/41]
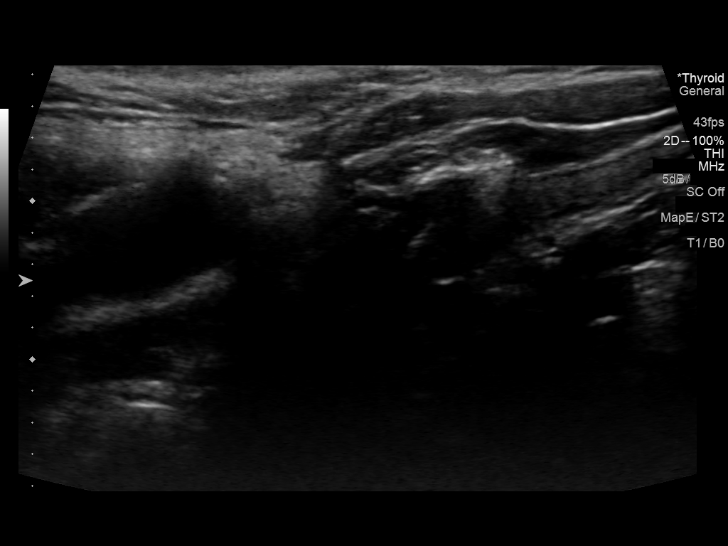
[im 41/41]
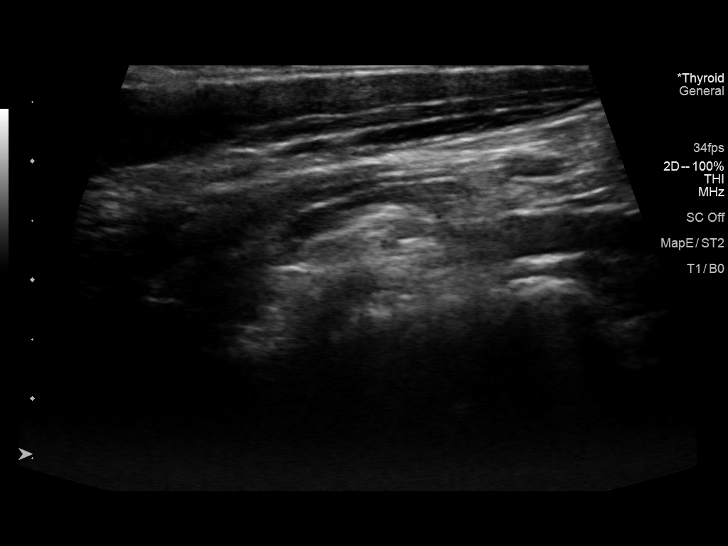

[14 of 25 positions shown; findings below may reference images not displayed]

FINDINGS: Parenchymal Echotexture: Normal

Isthmus: Normal in size measures 0.3 cm in diameter

Right lobe: Borderline enlarged measuring 5.3 x 2.5 x 1.0 cm

Left lobe: Normal in size measuring 4.6 x 1.7 x 1.0 cm

_________________________________________________________

Estimated total number of nodules >/= 1 cm: 0

Number of spongiform nodules >/=  2 cm not described below (TR1): 0

Number of mixed cystic and solid nodules >/= 1.5 cm not described
below (TR2): 0

_________________________________________________________

No discrete nodules are seen within the thyroid gland.
IMPRESSION: Nonspecific borderline enlargement of the thyroid gland without
discrete nodule or mass.

## 2020-03-20 DIAGNOSIS — R42 Dizziness and giddiness: Secondary | ICD-10-CM | POA: Diagnosis not present

## 2020-03-20 DIAGNOSIS — D509 Iron deficiency anemia, unspecified: Secondary | ICD-10-CM | POA: Diagnosis not present

## 2020-03-20 DIAGNOSIS — R079 Chest pain, unspecified: Secondary | ICD-10-CM | POA: Diagnosis not present

## 2020-09-10 DIAGNOSIS — N644 Mastodynia: Secondary | ICD-10-CM | POA: Diagnosis not present

## 2020-09-17 ENCOUNTER — Other Ambulatory Visit: Payer: Self-pay | Admitting: Physician Assistant

## 2020-09-18 ENCOUNTER — Other Ambulatory Visit: Payer: Self-pay | Admitting: Physician Assistant

## 2020-09-18 DIAGNOSIS — N644 Mastodynia: Secondary | ICD-10-CM

## 2020-09-23 DIAGNOSIS — H40033 Anatomical narrow angle, bilateral: Secondary | ICD-10-CM | POA: Diagnosis not present

## 2020-09-23 DIAGNOSIS — H04123 Dry eye syndrome of bilateral lacrimal glands: Secondary | ICD-10-CM | POA: Diagnosis not present

## 2020-10-30 ENCOUNTER — Ambulatory Visit
Admission: RE | Admit: 2020-10-30 | Discharge: 2020-10-30 | Disposition: A | Discharge status: Home / Self Care | Payer: BLUE CROSS/BLUE SHIELD | Source: Ambulatory Visit | Attending: Physician Assistant | Admitting: Physician Assistant

## 2020-10-30 ENCOUNTER — Ambulatory Visit
Admission: RE | Admit: 2020-10-30 | Discharge: 2020-10-30 | Disposition: A | Discharge status: Home / Self Care | Payer: BC Managed Care – PPO | Source: Ambulatory Visit | Attending: Physician Assistant | Admitting: Physician Assistant

## 2020-10-30 ENCOUNTER — Other Ambulatory Visit: Payer: Self-pay

## 2020-10-30 ENCOUNTER — Other Ambulatory Visit: Payer: BLUE CROSS/BLUE SHIELD

## 2020-10-30 DIAGNOSIS — N644 Mastodynia: Secondary | ICD-10-CM

## 2021-04-24 ENCOUNTER — Emergency Department (HOSPITAL_COMMUNITY)
Admission: EM | Admit: 2021-04-24 | Discharge: 2021-04-25 | Disposition: A | Discharge status: Home / Self Care | Payer: BC Managed Care – PPO | Attending: Emergency Medicine | Admitting: Emergency Medicine

## 2021-04-24 ENCOUNTER — Other Ambulatory Visit: Payer: Self-pay

## 2021-04-24 ENCOUNTER — Encounter (HOSPITAL_COMMUNITY): Payer: Self-pay | Admitting: Emergency Medicine

## 2021-04-24 ENCOUNTER — Emergency Department (HOSPITAL_COMMUNITY): Payer: BC Managed Care – PPO

## 2021-04-24 DIAGNOSIS — S61213A Laceration without foreign body of left middle finger without damage to nail, initial encounter: Secondary | ICD-10-CM | POA: Diagnosis not present

## 2021-04-24 DIAGNOSIS — Y9389 Activity, other specified: Secondary | ICD-10-CM | POA: Diagnosis not present

## 2021-04-24 DIAGNOSIS — Z23 Encounter for immunization: Secondary | ICD-10-CM | POA: Insufficient documentation

## 2021-04-24 DIAGNOSIS — Y9289 Other specified places as the place of occurrence of the external cause: Secondary | ICD-10-CM | POA: Insufficient documentation

## 2021-04-24 DIAGNOSIS — Y990 Civilian activity done for income or pay: Secondary | ICD-10-CM | POA: Insufficient documentation

## 2021-04-24 DIAGNOSIS — W275XXA Contact with paper-cutter, initial encounter: Secondary | ICD-10-CM | POA: Insufficient documentation

## 2021-04-24 DIAGNOSIS — S60943A Unspecified superficial injury of left middle finger, initial encounter: Secondary | ICD-10-CM | POA: Diagnosis not present

## 2021-04-24 NOTE — ED Triage Notes (Signed)
T reports cutting middle finger on Left hand today with a box cutter. Bleeding controlled at this time.

## 2021-04-25 ENCOUNTER — Encounter (HOSPITAL_COMMUNITY): Payer: Self-pay

## 2021-04-25 ENCOUNTER — Emergency Department (HOSPITAL_COMMUNITY)
Admission: EM | Admit: 2021-04-25 | Discharge: 2021-04-25 | Disposition: A | Discharge status: Home / Self Care | Payer: BC Managed Care – PPO | Source: Home / Self Care | Attending: Emergency Medicine | Admitting: Emergency Medicine

## 2021-04-25 DIAGNOSIS — Z23 Encounter for immunization: Secondary | ICD-10-CM | POA: Insufficient documentation

## 2021-04-25 DIAGNOSIS — S61213A Laceration without foreign body of left middle finger without damage to nail, initial encounter: Secondary | ICD-10-CM | POA: Insufficient documentation

## 2021-04-25 DIAGNOSIS — X788XXA Intentional self-harm by other sharp object, initial encounter: Secondary | ICD-10-CM | POA: Insufficient documentation

## 2021-04-25 MED ORDER — TETANUS-DIPHTH-ACELL PERTUSSIS 5-2.5-18.5 LF-MCG/0.5 IM SUSY
0.5000 mL | PREFILLED_SYRINGE | Freq: Once | INTRAMUSCULAR | Status: AC
  Administered 2021-04-25: 0.5 mL via INTRAMUSCULAR
  Filled 2021-04-25: qty 0.5

## 2021-04-25 MED ORDER — LIDOCAINE HCL (PF) 1 % IJ SOLN
5.0000 mL | Freq: Once | INTRAMUSCULAR | Status: AC
  Administered 2021-04-25: 5 mL
  Filled 2021-04-25: qty 30

## 2021-04-25 NOTE — ED Provider Notes (Signed)
[facility]-EMERGENCY DEPT Provider Note  CSN: [id number] Arrival date & time: 04/25/21 0113  Chief Complaint(s) Laceration  HPI Tina Gonzalez is a 30 y.o. female here for left middle finger laceration that occurred around 5 PM.  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  Bleeding was controlled.  She has associated mild dull discomfort worse with palpation and range of motion of the left middle finger.  Alleviated by mobility.  Still able to have full range motion of her finger.  No other injuries.  Last tetanus was approximately 7 years ago.   Patient went to [facility] emergency department but left due to prolonged wait times.  HPI  Past Medical History Past Medical History:  Diagnosis Date   Medical history non-contributory    Patient Active Problem List   Diagnosis Date Noted   SVD (spontaneous vaginal delivery) 09/25/2015   Oligohydramnios antepartum 09/24/2015   Hyperemesis gravidarum before end of [redacted] week gestation, dehydration 05/09/2015   Home Medication(s) Prior to Admission medications   Medication Sig Start Date End Date Taking? Authorizing Provider  ibuprofen (ADVIL,MOTRIN) 600 MG tablet Take 1 tablet (600 mg total) by mouth every 6 (six) hours. 09/27/15   Meisinger, Tawanna Cooler, MD                                                                                                                                    Past Surgical History Past Surgical History:  Procedure Laterality Date   TONGUE SURGERY     Family History Family History  Problem Relation Age of Onset   Hearing loss Neg Hx     Social History Social History   Tobacco Use   Smoking status: Never   Smokeless tobacco: Never  Substance Use Topics   Alcohol use: No   Drug use: No   Allergies Patient has no known allergies.  Review of Systems Review of Systems All other systems are reviewed and are negative for acute change  except as noted in the HPI  Physical Exam Vital Signs  I have reviewed the triage vital signs BP 119/80   Pulse 88   Temp 99.5 F (37.5 C) (Oral)   Resp 18   LMP 03/30/2021 (Approximate) Comment: pt shielded  SpO2 100%   Physical Exam Vitals reviewed.  Constitutional:      General: She is not in acute distress.    Appearance: She is well-developed. She is not diaphoretic.  HENT:     Head: Normocephalic and atraumatic.     Right Ear: External ear normal.     Left Ear: External ear normal.     Nose: Nose normal.  Eyes:     General: No scleral icterus.    Conjunctiva/sclera: Conjunctivae normal.  Neck:     Trachea: Phonation normal.  Cardiovascular:     Rate and Rhythm: Normal rate and regular  rhythm.  Pulmonary:     Effort: Pulmonary effort is normal. No respiratory distress.     Breath sounds: No stridor.  Abdominal:     General: There is no distension.  Musculoskeletal:        General: Normal range of motion.     Left hand: Laceration present.       Hands:     Cervical back: Normal range of motion.  Neurological:     Mental Status: She is alert and oriented to person, place, and time.  Psychiatric:        Behavior: Behavior normal.    ED Results and Treatments Labs (all labs ordered are listed, but only abnormal results are displayed) Labs Reviewed - No data to display                                                                                                                       EKG  EKG Interpretation  Date/Time:    Ventricular Rate:    PR Interval:    QRS Duration:   QT Interval:    QTC Calculation:   R Axis:     Text Interpretation:         Radiology DG Finger Middle Left  Result Date: 04/24/2021 CLINICAL DATA:  Left finger laceration. EXAM: LEFT MIDDLE FINGER 2+V COMPARISON:  None. FINDINGS: There is no evidence of fracture or dislocation. There is no evidence of arthropathy or other focal bone abnormality. Soft tissues are unremarkable.  IMPRESSION: Negative. Electronically Signed   By: Aram Candela M.D.   On: 04/24/2021 19:48    Pertinent labs & imaging results that were available during my care of the patient were reviewed by me and considered in my medical decision making (see MDM for details).  Medications Ordered in ED Medications  Tdap (BOOSTRIX) injection 0.5 mL (has no administration in time range)  lidocaine (PF) (XYLOCAINE) 1 % injection 5 mL (5 mLs Infiltration Given by Other 04/25/21 0202)                                                                                                                                     Procedures .Marland KitchenLaceration Repair  Date/Time: 04/25/2021 2:18 AM Performed by: Nira Conn, MD Authorized by: Nira Conn, MD   Consent:    Consent obtained:  Verbal   Consent given by:  Patient   Risks discussed:  Infection, poor cosmetic result and poor wound healing   Alternatives discussed:  Delayed treatment Universal protocol:    Procedure explained and questions answered to patient or proxy's satisfaction: yes     Patient identity confirmed:  Verbally with patient Anesthesia:    Anesthesia method:  Local infiltration   Local anesthetic:  Lidocaine 1% w/o epi Laceration details:    Location:  Finger   Finger location:  L long finger   Length (cm):  1.3   Depth (mm):  3 Pre-procedure details:    Preparation:  Patient was prepped and draped in usual sterile fashion Exploration:    Hemostasis achieved with:  Direct pressure   Wound extent: no fascia violation noted, no foreign bodies/material noted, no muscle damage noted and no tendon damage noted   Treatment:    Area cleansed with:  Povidone-iodine   Amount of cleaning:  Extensive   Irrigation solution:  Sterile saline   Irrigation volume:  750cc   Irrigation method:  Pressure wash   Debridement:  None Skin repair:    Repair method:  Sutures   Suture size:  4-0   Suture material:  Prolene    Suture technique:  Horizontal mattress   Number of sutures:  1 Approximation:    Approximation:  Close Repair type:    Repair type:  Simple Post-procedure details:    Dressing:  Non-adherent dressing  (including critical care time)  Medical Decision Making / ED Course I have reviewed the nursing notes for this encounter and the patient's prior records (if available in EHR or on provided paperwork).  Tina Gonzalez was evaluated in Emergency Department on 04/25/2021 for the symptoms described in the history of present illness. She was evaluated in the context of the global COVID-19 pandemic, which necessitated consideration that the patient might be at risk for infection with the SARS-CoV-2 virus that causes COVID-19. Institutional protocols and algorithms that pertain to the evaluation of patients at risk for COVID-19 are in a state of rapid change based on information released by regulatory bodies including the CDC and federal and state organizations. These policies and algorithms were followed during the patient's care in the ED.     Left middle finger laceration thoroughly irrigated and closed as above Tdap booster given. Return for suture removal.   Final Clinical Impression(s) / ED Diagnoses Final diagnoses:  Laceration of left middle finger without foreign body without damage to nail, initial encounter   The patient appears reasonably screened and/or stabilized for discharge and I doubt any other medical condition or other [facility] requiring further screening, evaluation, or treatment in the ED at this time prior to discharge. Safe for discharge with strict return precautions.  Disposition: Discharge  Condition: Good  I have discussed the results, Dx and Tx plan with the patient/family who expressed understanding and agree(s) with the plan. Discharge instructions discussed at length. The patient/family was given strict return precautions who verbalized understanding of the  instructions. No further questions at time of discharge.    ED Discharge Orders     None        Follow Up: [facility] [address] [id number] mc [place] [address] [phone]  for suture removal in 10-14 days    This chart was dictated using voice recognition software.  Despite best efforts to proofread,  errors can occur which can change the documentation meaning.    Nira Conn, MD 04/25/21 [phone]

## 2021-04-25 NOTE — ED Triage Notes (Signed)
Pt reports cutting middle finger on left hand today with a box cutter. Bleeding controlled at this time.

## 2021-05-09 ENCOUNTER — Other Ambulatory Visit: Payer: Self-pay

## 2021-05-09 ENCOUNTER — Emergency Department (HOSPITAL_COMMUNITY)
Admission: EM | Admit: 2021-05-09 | Discharge: 2021-05-09 | Disposition: A | Discharge status: Home / Self Care | Payer: BC Managed Care – PPO | Attending: Emergency Medicine | Admitting: Emergency Medicine

## 2021-05-09 ENCOUNTER — Encounter (HOSPITAL_COMMUNITY): Payer: Self-pay | Admitting: Emergency Medicine

## 2021-05-09 DIAGNOSIS — Z4802 Encounter for removal of sutures: Secondary | ICD-10-CM | POA: Insufficient documentation

## 2021-05-09 NOTE — ED Notes (Addendum)
Two sutures removed from pt's middle finger. Band-aide applied. Tolerated well.

## 2021-05-09 NOTE — ED Triage Notes (Signed)
Pt states she needs her two sutures in her finger removed, had it stitched 10/28. No issues.

## 2021-05-09 NOTE — ED Provider Notes (Signed)
[facility] Provider Note   CSN: [id number] Arrival date & time: 05/09/21  1214     History No chief complaint on file.   Tina Gonzalez is a 30 y.o. female.  Pt here for suture removal.  Pt denies any complaints. No fever, no redness.  Pt had sutures on 10/28  The history is provided by the patient. No language interpreter was used.      Past Medical History:  Diagnosis Date   Medical history non-contributory     Patient Active Problem List   Diagnosis Date Noted   SVD (spontaneous vaginal delivery) 09/25/2015   Oligohydramnios antepartum 09/24/2015   Hyperemesis gravidarum before end of [redacted] week gestation, dehydration 05/09/2015    Past Surgical History:  Procedure Laterality Date   TONGUE SURGERY       OB History     Gravida  2   Para  2   Term  2   Preterm  0   AB  0   Living  2      SAB  0   IAB  0   Ectopic  0   Multiple      Live Births  2           Family History  Problem Relation Age of Onset   Hearing loss Neg Hx     Social History   Tobacco Use   Smoking status: Never   Smokeless tobacco: Never  Substance Use Topics   Alcohol use: No   Drug use: No    Home Medications Prior to Admission medications   Medication Sig Start Date End Date Taking? Authorizing Provider  ibuprofen (ADVIL,MOTRIN) 600 MG tablet Take 1 tablet (600 mg total) by mouth every 6 (six) hours. 09/27/15   Meisinger, Tawanna Cooler, MD    Allergies    Patient has no known allergies.  Review of Systems   Review of Systems  All other systems reviewed and are negative.  Physical Exam Updated Vital Signs BP 119/69 (BP Location: Left Arm)   Pulse (!) 103   Temp 98.3 F (36.8 C) (Oral)   Resp 16   SpO2 100%   Physical Exam Vitals reviewed.  Constitutional:      Appearance: Normal appearance.  Musculoskeletal:        General: No swelling or tenderness.     Comments: Healed laceration left 3rd finger   Skin:     General: Skin is warm.  Neurological:     General: No focal deficit present.     Mental Status: She is alert.  Psychiatric:        Mood and Affect: Mood normal.    ED Results / Procedures / Treatments   Labs (all labs ordered are listed, but only abnormal results are displayed) Labs Reviewed - No data to display  EKG None  Radiology No results found.  Procedures Procedures   Medications Ordered in ED Medications - No data to display  ED Course  I have reviewed the triage vital signs and the nursing notes.  Pertinent labs & imaging results that were available during my care of the patient were reviewed by me and considered in my medical decision making (see chart for details).    MDM Rules/Calculators/A&P                           MDM:  sutures removed Final Clinical Impression(s) / ED Diagnoses Final diagnoses:  Visit  for suture removal    Rx / DC Orders ED Discharge Orders     None     An After Visit Summary was printed and given to the patient.    Osie Cheeks 05/09/21 2138    Terrilee Files, MD 05/12/21 [phone]

## 2022-01-17 ENCOUNTER — Emergency Department (HOSPITAL_COMMUNITY)
Admission: EM | Admit: 2022-01-17 | Discharge: 2022-01-17 | Disposition: A | Discharge status: Home / Self Care | Payer: BC Managed Care – PPO | Attending: Emergency Medicine | Admitting: Emergency Medicine

## 2022-01-17 ENCOUNTER — Encounter (HOSPITAL_COMMUNITY): Payer: Self-pay

## 2022-01-17 ENCOUNTER — Emergency Department (HOSPITAL_COMMUNITY): Payer: BC Managed Care – PPO

## 2022-01-17 ENCOUNTER — Other Ambulatory Visit: Payer: Self-pay

## 2022-01-17 DIAGNOSIS — R0989 Other specified symptoms and signs involving the circulatory and respiratory systems: Secondary | ICD-10-CM | POA: Insufficient documentation

## 2022-01-17 DIAGNOSIS — F458 Other somatoform disorders: Secondary | ICD-10-CM | POA: Diagnosis not present

## 2022-01-17 MED ORDER — LIDOCAINE VISCOUS HCL 2 % MT SOLN
15.0000 mL | Freq: Once | OROMUCOSAL | Status: DC
  Filled 2022-01-17: qty 15

## 2022-01-17 NOTE — ED Triage Notes (Signed)
Patient ate some asparagus and thinks it is stuck in her throat. She made herself throw up but still feels like it is there.

## 2022-01-17 NOTE — Discharge Instructions (Addendum)
It was a pleasure caring for you today in the emergency department. ° °Please return to the emergency department for any worsening or worrisome symptoms. ° ° °

## 2022-01-17 NOTE — ED Provider Notes (Signed)
[facility] Provider Note   CSN: [id number] Arrival date & time: 01/17/22  0232     History  Chief Complaint  Patient presents with   Food Bolus    Tina Gonzalez is a 31 y.o. female.  Patient as above with significant medical history as below, including no significant medical history who presents to the ED with complaint of food bolus.  Patient reports she was eating chicken and asparagus, after swallowing a bite she began to have discomfort to the posterior oropharynx.  She swallowed a few more times and felt the discomfort further down her esophagus.  She attempted to make herself vomit which did not alleviate her symptoms.  She has been tolerating p.o. intake since the event without significant difficulty.  No dyspnea.  No nausea or vomiting that has not been self-induced.  No history of similar symptoms in the past.  No history of prior endoscopy.     Past Medical History:  Diagnosis Date   Medical history non-contributory     Past Surgical History:  Procedure Laterality Date   TONGUE SURGERY       The history is provided by the patient and a parent. No language interpreter was used.       Home Medications Prior to Admission medications   Medication Sig Start Date End Date Taking? Authorizing Provider  ibuprofen (ADVIL,MOTRIN) 600 MG tablet Take 1 tablet (600 mg total) by mouth every 6 (six) hours. 09/27/15   Meisinger, Tawanna Cooler, MD      Allergies    Patient has no known allergies.    Review of Systems   Review of Systems  Constitutional:  Negative for activity change and fever.  HENT:  Positive for sore throat. Negative for facial swelling and trouble swallowing.   Eyes:  Negative for discharge and redness.  Respiratory:  Negative for cough and shortness of breath.   Cardiovascular:  Negative for chest pain and palpitations.  Gastrointestinal:  Negative for abdominal pain and nausea.  Genitourinary:  Negative for dysuria and  flank pain.  Musculoskeletal:  Negative for back pain and gait problem.  Skin:  Negative for pallor and rash.  Neurological:  Negative for syncope and headaches.    Physical Exam Updated Vital Signs BP (!) 125/99 (BP Location: Right Arm)   Pulse (!) 105   Temp 98.3 F (36.8 C) (Oral)   Resp 18   Ht 5\' 5"  (1.651 m)   Wt 64.4 kg   LMP 12/29/2021 (Approximate)   SpO2 100%   BMI 23.63 kg/m  Physical Exam Vitals and nursing note reviewed.  Constitutional:      General: She is not in acute distress.    Appearance: Normal appearance.  HENT:     Head: Normocephalic and atraumatic.     Right Ear: External ear normal.     Left Ear: External ear normal.     Nose: Nose normal.     Mouth/Throat:     Mouth: Mucous membranes are moist.  Eyes:     General: No scleral icterus.       Right eye: No discharge.        Left eye: No discharge.  Cardiovascular:     Rate and Rhythm: Normal rate and regular rhythm.     Pulses: Normal pulses.     Heart sounds: Normal heart sounds.  Pulmonary:     Effort: Pulmonary effort is normal. No tachypnea, accessory muscle usage or respiratory distress.     Breath  sounds: Normal breath sounds.  Abdominal:     General: Abdomen is flat.     Palpations: Abdomen is soft.     Tenderness: There is no abdominal tenderness.  Musculoskeletal:        General: Normal range of motion.     Cervical back: Normal range of motion.     Right lower leg: No edema.     Left lower leg: No edema.  Skin:    General: Skin is warm and dry.     Capillary Refill: Capillary refill takes less than 2 seconds.  Neurological:     Mental Status: She is alert.  Psychiatric:        Mood and Affect: Mood normal.        Behavior: Behavior normal.     ED Results / Procedures / Treatments   Labs (all labs ordered are listed, but only abnormal results are displayed) Labs Reviewed - No data to display  EKG None  Radiology DG Neck Soft Tissue  Result Date:  01/17/2022 CLINICAL DATA:  Foreign body sensation in throat EXAM: NECK SOFT TISSUES - 1+ VIEW COMPARISON:  None Available. FINDINGS: There is no evidence of retropharyngeal soft tissue swelling or epiglottic enlargement. The cervical airway is unremarkable and no radio-opaque foreign body identified. IMPRESSION: Negative. Electronically Signed   By: Charline Bills M.D.   On: 01/17/2022 03:35    Procedures Procedures    Medications Ordered in ED Medications  lidocaine (XYLOCAINE) 2 % viscous mouth solution 15 mL (15 mLs Mouth/Throat Not Given 01/17/22 4132)    ED Course/ Medical Decision Making/ A&P                           Medical Decision Making Amount and/or Complexity of Data Reviewed Radiology: ordered.  Risk Prescription drug management.    CC: Foreign body sensation, possible food bolus  This patient presents to the Emergency Department for the above complaint. This involves an extensive number of treatment options and is a complaint that carries with it a high risk of complications and morbidity. Vital signs were reviewed. Serious etiologies considered.  Record review:  Previous records obtained and reviewed prior ED visits, prior labs and imaging  Additional history obtained from father at bedside  Medical and surgical history as noted above.   Work up as above, notable for:  Labs & imaging results that were available during my care of the patient were visualized by me and considered in my medical decision making.  Physical exam as above.   I ordered imaging studies which included cxr soft tissue. I visualized the imaging, interpreted images, and I agree with radiologist interpretation. No fb noted.   Management: P.o. challenge completed Patient was offered viscous lidocaine to which she refused  ED Course:     Reassessment:  Symptoms improved.  Admission was considered.    She is tolerant p.o. intake without difficulty.  My suspicion for food bolus is  exceedingly low.  No foreign body appreciated on x-ray.  She is breathing comfortably on ambient air.  No hypoxia.  No conversational dyspnea.  Patient with likely globus sensation, I have low suspicion for impacted food bolus or foreign body  The patient improved significantly and was discharged in stable condition. Detailed discussions were had with the patient regarding current findings, and need for close f/u with PCP or on call doctor. The patient has been instructed to return immediately if the symptoms worsen in  any way for re-evaluation. Patient verbalized understanding and is in agreement with current care plan. All questions answered prior to discharge.            Social determinants of health include -  Social History   Socioeconomic History   Marital status: Married    Spouse name: Not on file   Number of children: Not on file   Years of education: Not on file   Highest education level: Not on file  Occupational History   Not on file  Tobacco Use   Smoking status: Never   Smokeless tobacco: Never  Substance and Sexual Activity   Alcohol use: No   Drug use: No   Sexual activity: Yes  Other Topics Concern   Not on file  Social History Narrative   ** Merged History Encounter **       Social Determinants of Health   Financial Resource Strain: Not on file  Food Insecurity: Not on file  Transportation Needs: Not on file  Physical Activity: Not on file  Stress: Not on file  Social Connections: Not on file  Intimate Partner Violence: Not on file      This chart was dictated using voice recognition software.  Despite best efforts to proofread,  errors can occur which can change the documentation meaning.         Final Clinical Impression(s) / ED Diagnoses Final diagnoses:  Globus sensation    Rx / DC Orders ED Discharge Orders     None         Sloan Leiter, DO 01/17/22 0448

## 2022-01-19 ENCOUNTER — Ambulatory Visit (HOSPITAL_COMMUNITY)
Admission: EM | Admit: 2022-01-19 | Discharge: 2022-01-19 | Disposition: A | Discharge status: Home / Self Care | Payer: BC Managed Care – PPO | Attending: Physician Assistant | Admitting: Physician Assistant

## 2022-01-19 ENCOUNTER — Encounter (HOSPITAL_COMMUNITY): Payer: Self-pay

## 2022-01-19 DIAGNOSIS — R079 Chest pain, unspecified: Secondary | ICD-10-CM | POA: Diagnosis not present

## 2022-01-19 DIAGNOSIS — R42 Dizziness and giddiness: Secondary | ICD-10-CM

## 2022-01-19 LAB — CBC WITH DIFFERENTIAL/PLATELET
Abs Immature Granulocytes: 0.02 10*3/uL (ref 0.00–0.07)
Basophils Absolute: 0 10*3/uL (ref 0.0–0.1)
Basophils Relative: 0 %
Eosinophils Absolute: 0 10*3/uL (ref 0.0–0.5)
Eosinophils Relative: 0 %
HCT: 33.8 % — ABNORMAL LOW (ref 36.0–46.0)
Hemoglobin: 11 g/dL — ABNORMAL LOW (ref 12.0–15.0)
Immature Granulocytes: 0 %
Lymphocytes Relative: 36 %
Lymphs Abs: 2 10*3/uL (ref 0.7–4.0)
MCH: 23.6 pg — ABNORMAL LOW (ref 26.0–34.0)
MCHC: 32.5 g/dL (ref 30.0–36.0)
MCV: 72.4 fL — ABNORMAL LOW (ref 80.0–100.0)
Monocytes Absolute: 0.5 10*3/uL (ref 0.1–1.0)
Monocytes Relative: 8 %
Neutro Abs: 3.1 10*3/uL (ref 1.7–7.7)
Neutrophils Relative %: 56 %
Platelets: 307 10*3/uL (ref 150–400)
RBC: 4.67 MIL/uL (ref 3.87–5.11)
RDW: 17.2 % — ABNORMAL HIGH (ref 11.5–15.5)
WBC: 5.6 10*3/uL (ref 4.0–10.5)
nRBC: 0 % (ref 0.0–0.2)

## 2022-01-19 LAB — POCT URINALYSIS DIPSTICK, ED / UC
Bilirubin Urine: NEGATIVE
Glucose, UA: NEGATIVE mg/dL
Hgb urine dipstick: NEGATIVE
Ketones, ur: >=160 mg/dL — AB
Nitrite: NEGATIVE
Protein, ur: NEGATIVE mg/dL
Specific Gravity, Urine: 1.015 (ref 1.005–1.030)
Urobilinogen, UA: 0.2 mg/dL (ref 0.0–1.0)
pH: 5.5 (ref 5.0–8.0)

## 2022-01-19 LAB — COMPREHENSIVE METABOLIC PANEL
ALT: 15 U/L (ref 0–44)
AST: 19 U/L (ref 15–41)
Albumin: 4.4 g/dL (ref 3.5–5.0)
Alkaline Phosphatase: 51 U/L (ref 38–126)
Anion gap: 7 (ref 5–15)
BUN: 6 mg/dL (ref 6–20)
CO2: 23 mmol/L (ref 22–32)
Calcium: 9.6 mg/dL (ref 8.9–10.3)
Chloride: 104 mmol/L (ref 98–111)
Creatinine, Ser: 0.72 mg/dL (ref 0.44–1.00)
GFR, Estimated: >60 mL/min (ref >60)
Glucose, Bld: 98 mg/dL (ref 70–99)
Potassium: 3.9 mmol/L (ref 3.5–5.1)
Sodium: 134 mmol/L — ABNORMAL LOW (ref 135–145)
Total Bilirubin: 0.8 mg/dL (ref 0.3–1.2)
Total Protein: 8 g/dL (ref 6.5–8.1)

## 2022-01-19 LAB — CBG MONITORING, ED: Glucose-Capillary: 100 mg/dL — ABNORMAL HIGH (ref 70–99)

## 2022-01-19 LAB — POC URINE PREG, ED: Preg Test, Ur: NEGATIVE

## 2022-01-19 NOTE — Discharge Instructions (Signed)
Your work-up was essentially normal.  I will contact you with your lab work if anything is abnormal.  I do recommend that you increase oral intake by pushing fluids and eating small frequent meals.  Please follow-up with your primary care provider.  Follow-up with cardiology as well.  Call to schedule an appointment.  If anything worsens you need to go to the emergency room.

## 2022-01-19 NOTE — ED Provider Notes (Addendum)
[facility]    CSN: [id number] Arrival date & time: 01/19/22  1713      History   Chief Complaint Chief Complaint  Patient presents with   Chest Pain   Back Pain   Abdominal Pain   Dizziness    HPI Tina Gonzalez is a 31 y.o. female.   Patient presents today with a variety of concerns.  Reports symptoms began after she had a choking episode on 01/17/2022.  This resulted in a feeling that there is something stuck in her throat and so she was evaluated by the emergency room at which point work-up was negative and she was diagnosed with globus sensation.  The following day she had a severe episode of right upper quadrant/chest pain that spread to her right shoulder and into her right arm.  This lasted for approximately 5 to 6 minutes and resolved without intervention.  She is unsure if this was related to oral intake.  She denies previous abdominal surgery and still has gallbladder.  This happened a second time and lasted for few minutes and resolving again without intervention.  Today she has had episodic lightheadedness with last episode a few hours ago.  She denies any syncope, nausea, vomiting, current abdominal pain, dysarthria, visual disturbance.  She reports symptoms are worsening and becoming more frequent prompting evaluation.  She has been resting and denies any known trigger of current symptoms.  Denies any recent illness or additional symptoms including cough/congestion.  Denies any recent medication changes or head injury.    Past Medical History:  Diagnosis Date   Medical history non-contributory     Patient Active Problem List   Diagnosis Date Noted   SVD (spontaneous vaginal delivery) 09/25/2015   Oligohydramnios antepartum 09/24/2015   Hyperemesis gravidarum before end of [redacted] week gestation, dehydration 05/09/2015    Past Surgical History:  Procedure Laterality Date   TONGUE SURGERY      OB History     Gravida  2   Para  2   Term  2   Preterm   0   AB  0   Living  2      SAB  0   IAB  0   Ectopic  0   Multiple      Live Births  2            Home Medications    Prior to Admission medications   Medication Sig Start Date End Date Taking? Authorizing Provider  ibuprofen (ADVIL,MOTRIN) 600 MG tablet Take 1 tablet (600 mg total) by mouth every 6 (six) hours. 09/27/15   Meisinger, Tawanna Cooler, MD    Family History Family History  Problem Relation Age of Onset   Hearing loss Neg Hx     Social History Social History   Tobacco Use   Smoking status: Never   Smokeless tobacco: Never  Vaping Use   Vaping Use: Never used  Substance Use Topics   Alcohol use: No    Comment: social   Drug use: No     Allergies   Patient has no known allergies.   Review of Systems Review of Systems  Constitutional:  Positive for activity change and appetite change. Negative for fatigue and fever.  HENT:  Negative for congestion, sinus pressure, sneezing and sore throat.   Respiratory:  Negative for cough and shortness of breath.   Cardiovascular:  Positive for chest pain (Resolved).  Gastrointestinal:  Positive for abdominal pain (Improved). Negative for diarrhea, nausea and  vomiting.  Musculoskeletal:  Negative for arthralgias and myalgias.  Neurological:  Positive for light-headedness. Negative for dizziness, weakness, numbness and headaches.     Physical Exam Triage Vital Signs ED Triage Vitals  Enc Vitals Group     BP 01/19/22 1909 113/74     Pulse Rate 01/19/22 1909 95     Resp 01/19/22 1909 18     Temp 01/19/22 1909 98.6 F (37 C)     Temp Source 01/19/22 1909 Oral     SpO2 01/19/22 1909 95 %     Weight --      Height --      Head Circumference --      Peak Flow --      Pain Score 01/19/22 1910 0     Pain Loc --      Pain Edu? --      Excl. in GC? --    Orthostatic VS for the past 24 hrs:  BP- Lying Pulse- Lying BP- Sitting Pulse- Sitting BP- Standing at 0 minutes Pulse- Standing at 0 minutes  01/19/22  1938 119/77 94 109/78 97 112/77 115    Updated Vital Signs BP 113/74 (BP Location: Left Arm)   Pulse 95   Temp 98.6 F (37 C) (Oral)   Resp 18   LMP 12/29/2021 (Approximate)   SpO2 95%   Visual Acuity Right Eye Distance:   Left Eye Distance:   Bilateral Distance:    Right Eye Near:   Left Eye Near:    Bilateral Near:     Physical Exam Vitals reviewed.  Constitutional:      General: She is awake. She is not in acute distress.    Appearance: Normal appearance. She is well-developed. She is not ill-appearing.     Comments: Very pleasant female appears stated age in no acute distress sitting comfortably in exam room  HENT:     Head: Normocephalic and atraumatic. No raccoon eyes, Battle's sign or contusion.     Right Ear: Tympanic membrane, ear canal and external ear normal. No hemotympanum.     Left Ear: Tympanic membrane, ear canal and external ear normal. No hemotympanum.     Mouth/Throat:     Tongue: Tongue does not deviate from midline.     Pharynx: Uvula midline. No oropharyngeal exudate or posterior oropharyngeal erythema.  Eyes:     Extraocular Movements: Extraocular movements intact.     Conjunctiva/sclera: Conjunctivae normal.     Pupils: Pupils are equal, round, and reactive to light.  Cardiovascular:     Rate and Rhythm: Normal rate and regular rhythm.     Heart sounds: Normal heart sounds, S1 normal and S2 normal. No murmur heard. Pulmonary:     Effort: Pulmonary effort is normal.     Breath sounds: Normal breath sounds. No wheezing, rhonchi or rales.     Comments: Clear to auscultation bilaterally Chest:     Chest wall: No deformity, swelling or tenderness.  Abdominal:     General: Bowel sounds are normal.     Palpations: Abdomen is soft.     Tenderness: There is no abdominal tenderness. There is no right CVA tenderness, left CVA tenderness, guarding or rebound. Negative signs include Murphy's sign.     Comments: Benign abdominal exam  Musculoskeletal:      Cervical back: Normal range of motion and neck supple. No spinous process tenderness or muscular tenderness.  Neurological:     General: No focal deficit present.     Cranial  Nerves: Cranial nerves 2-12 are intact.     Motor: Motor function is intact.     Coordination: Coordination is intact.     Gait: Gait is intact.     Comments: No focal neurological defect on exam.  Psychiatric:        Behavior: Behavior is cooperative.      UC Treatments / Results  Labs (all labs ordered are listed, but only abnormal results are displayed) Labs Reviewed  POCT URINALYSIS DIPSTICK, ED / UC - Abnormal; Notable for the following components:      Result Value   Ketones, ur >=160 (*)    Leukocytes,Ua TRACE (*)    All other components within normal limits  CBG MONITORING, ED - Abnormal; Notable for the following components:   Glucose-Capillary 100 (*)    All other components within normal limits  URINE CULTURE  CBC WITH DIFFERENTIAL/PLATELET  COMPREHENSIVE METABOLIC PANEL  POC URINE PREG, ED    EKG   Radiology No results found.  Procedures Procedures (including critical care time)  Medications Ordered in UC Medications - No data to display  Initial Impression / Assessment and Plan / UC Course  I have reviewed the triage vital signs and the nursing notes.  Pertinent labs & imaging results that were available during my care of the patient were reviewed by me and considered in my medical decision making (see chart for details).     Patient is well-appearing, afebrile, nontoxic, nontachycardic.  Vital signs and physical exam reassuring today; no indication for emergent evaluation or imaging.  EKG was obtained that showed normal sinus rhythm with ventricular rate of 92 bpm without ischemic changes; no previous to compare.  Orthostatic vital signs were appropriate though she did have an elevation in her heart rate from laying to standing.  Pregnancy test was negative.  UA showed trace  leukocyte esterase and ketones.  CBC and CMP obtained today-results pending.  We will contact her if this is abnormal and we need to change our treatment plan.  She was encouraged to drink plenty of fluid and eat small frequent meals.  Discussed that her pain pattern could be consistent with a gallbladder issue and if she has recurrent pain she should follow-up with her primary care to consider outpatient ultrasound or severe go to the ER.  Given intermittent chest pain and lightheadedness did recommend that she follow-up with cardiology.  She was given contact information for local provider with instruction to call to schedule an appointment.  If she has any recurrent symptoms including chest pain, shortness of breath, nausea/vomiting interfering with oral intake, syncopal episodes, severe lightheadedness she needs to go to the emergency room immediately.  Strict return precautions given.  Work excuse note provided.  Final Clinical Impressions(s) / UC Diagnoses   Final diagnoses:  Episodic lightheadedness  Intermittent chest pain     Discharge Instructions      Your work-up was essentially normal.  I will contact you with your lab work if anything is abnormal.  I do recommend that you increase oral intake by pushing fluids and eating small frequent meals.  Please follow-up with your primary care provider.  Follow-up with cardiology as well.  Call to schedule an appointment.  If anything worsens you need to go to the emergency room.     ED Prescriptions   None    PDMP not reviewed this encounter.   Jeani Hawking, PA-C 01/19/22 2028    Dylynn Ketner, Noberto Retort, PA-C 01/19/22 2029

## 2022-01-19 NOTE — ED Triage Notes (Signed)
Pt has multiple complaints. Pt states she was seen in the ED 01/17/22 for sensation of something stuck in her throat. Pt states now she has intermittent pain in multiple areas.  Pt states she has had pain in her right forearm, right abd, right chest, right upper back, facial pressure and cloudiness in her head. Pt denies pain at this time.

## 2022-01-20 DIAGNOSIS — H938X3 Other specified disorders of ear, bilateral: Secondary | ICD-10-CM | POA: Diagnosis not present

## 2022-01-20 DIAGNOSIS — J3489 Other specified disorders of nose and nasal sinuses: Secondary | ICD-10-CM | POA: Diagnosis not present

## 2022-01-20 DIAGNOSIS — R1319 Other dysphagia: Secondary | ICD-10-CM | POA: Diagnosis not present

## 2022-01-20 LAB — URINE CULTURE: Culture: NO GROWTH

## 2022-01-22 ENCOUNTER — Ambulatory Visit: Payer: BC Managed Care – PPO | Admitting: Internal Medicine

## 2022-01-28 DIAGNOSIS — R1310 Dysphagia, unspecified: Secondary | ICD-10-CM | POA: Diagnosis not present

## 2022-01-28 DIAGNOSIS — F458 Other somatoform disorders: Secondary | ICD-10-CM | POA: Diagnosis not present

## 2022-01-28 DIAGNOSIS — D649 Anemia, unspecified: Secondary | ICD-10-CM | POA: Diagnosis not present

## 2022-01-28 DIAGNOSIS — R1011 Right upper quadrant pain: Secondary | ICD-10-CM | POA: Diagnosis not present

## 2022-01-30 ENCOUNTER — Emergency Department (HOSPITAL_COMMUNITY): Payer: BC Managed Care – PPO

## 2022-01-30 ENCOUNTER — Emergency Department (HOSPITAL_COMMUNITY)
Admission: EM | Admit: 2022-01-30 | Discharge: 2022-01-30 | Disposition: A | Discharge status: Home / Self Care | Payer: BC Managed Care – PPO | Attending: Emergency Medicine | Admitting: Emergency Medicine

## 2022-01-30 ENCOUNTER — Other Ambulatory Visit: Payer: Self-pay

## 2022-01-30 ENCOUNTER — Encounter (HOSPITAL_COMMUNITY): Payer: Self-pay

## 2022-01-30 DIAGNOSIS — Z0389 Encounter for observation for other suspected diseases and conditions ruled out: Secondary | ICD-10-CM | POA: Diagnosis not present

## 2022-01-30 DIAGNOSIS — R531 Weakness: Secondary | ICD-10-CM | POA: Diagnosis not present

## 2022-01-30 DIAGNOSIS — R1319 Other dysphagia: Secondary | ICD-10-CM | POA: Insufficient documentation

## 2022-01-30 DIAGNOSIS — R1310 Dysphagia, unspecified: Secondary | ICD-10-CM

## 2022-01-30 DIAGNOSIS — R1312 Dysphagia, oropharyngeal phase: Secondary | ICD-10-CM | POA: Diagnosis not present

## 2022-01-30 DIAGNOSIS — R0602 Shortness of breath: Secondary | ICD-10-CM | POA: Diagnosis not present

## 2022-01-30 LAB — COMPREHENSIVE METABOLIC PANEL
ALT: 13 U/L (ref 0–44)
AST: 18 U/L (ref 15–41)
Albumin: 4.2 g/dL (ref 3.5–5.0)
Alkaline Phosphatase: 44 U/L (ref 38–126)
Anion gap: 6 (ref 5–15)
BUN: 6 mg/dL (ref 6–20)
CO2: 25 mmol/L (ref 22–32)
Calcium: 9.8 mg/dL (ref 8.9–10.3)
Chloride: 105 mmol/L (ref 98–111)
Creatinine, Ser: 0.78 mg/dL (ref 0.44–1.00)
GFR, Estimated: >60 mL/min (ref >60)
Glucose, Bld: 122 mg/dL — ABNORMAL HIGH (ref 70–99)
Potassium: 3.4 mmol/L — ABNORMAL LOW (ref 3.5–5.1)
Sodium: 136 mmol/L (ref 135–145)
Total Bilirubin: 0.8 mg/dL (ref 0.3–1.2)
Total Protein: 7.7 g/dL (ref 6.5–8.1)

## 2022-01-30 LAB — CBC WITH DIFFERENTIAL/PLATELET
Abs Immature Granulocytes: 0.01 10*3/uL (ref 0.00–0.07)
Basophils Absolute: 0 10*3/uL (ref 0.0–0.1)
Basophils Relative: 1 %
Eosinophils Absolute: 0 10*3/uL (ref 0.0–0.5)
Eosinophils Relative: 1 %
HCT: 36.1 % (ref 36.0–46.0)
Hemoglobin: 11.2 g/dL — ABNORMAL LOW (ref 12.0–15.0)
Immature Granulocytes: 0 %
Lymphocytes Relative: 42 %
Lymphs Abs: 1.5 10*3/uL (ref 0.7–4.0)
MCH: 23.1 pg — ABNORMAL LOW (ref 26.0–34.0)
MCHC: 31 g/dL (ref 30.0–36.0)
MCV: 74.4 fL — ABNORMAL LOW (ref 80.0–100.0)
Monocytes Absolute: 0.2 10*3/uL (ref 0.1–1.0)
Monocytes Relative: 6 %
Neutro Abs: 1.8 10*3/uL (ref 1.7–7.7)
Neutrophils Relative %: 50 %
Platelets: 387 10*3/uL (ref 150–400)
RBC: 4.85 MIL/uL (ref 3.87–5.11)
RDW: 18.1 % — ABNORMAL HIGH (ref 11.5–15.5)
WBC: 3.6 10*3/uL — ABNORMAL LOW (ref 4.0–10.5)
nRBC: 0 % (ref 0.0–0.2)

## 2022-01-30 MED ORDER — LIDOCAINE VISCOUS HCL 2 % MT SOLN
15.0000 mL | Freq: Once | OROMUCOSAL | Status: AC
  Administered 2022-01-30: 15 mL via ORAL
  Filled 2022-01-30: qty 15

## 2022-01-30 MED ORDER — IOHEXOL 300 MG/ML  SOLN
75.0000 mL | Freq: Once | INTRAMUSCULAR | Status: AC | PRN
  Administered 2022-01-30: 75 mL via INTRAVENOUS

## 2022-01-30 MED ORDER — MAALOX MAX 400-400-40 MG/5ML PO SUSP: 10.0000 mL | Freq: Four times a day (QID) | ORAL | 0 refills | Status: AC | PRN

## 2022-01-30 MED ORDER — ALUM & MAG HYDROXIDE-SIMETH 200-200-20 MG/5ML PO SUSP
30.0000 mL | Freq: Once | ORAL | Status: AC
  Administered 2022-01-30: 30 mL via ORAL
  Filled 2022-01-30: qty 30

## 2022-01-30 NOTE — ED Triage Notes (Signed)
Pt reports she was was recently seen here for choking on food. Pt reports since then she feels like there is something stuck in her throat and endorses SHOB.

## 2022-01-30 NOTE — ED Provider Notes (Signed)
[facility] DEPT Provider Note   CSN: [id number] Arrival date & time: 01/30/22  1401     History  Chief Complaint  Patient presents with   Shortness of Breath   Dysphagia    Tina Gonzalez is a 31 y.o. female.  Patient is a 31 year old female presenting for food globulus.  Patient states on 7/22 she was eating chicken when she immediately had pain in her throat and felt like it got stuck.  States she had minimal improvement throughout the night and began having worsening throat pain, chest pain, that radiated to her right shoulder blade.  States the severe pain has subsided at this time but still feels like she has something stuck in her throat and has difficulty eating.  States she is only able to tolerate chicken noodle soup at this time and liquids.  No vomiting.  No prior episodes of choking or food boluses.  Prior episodes of pill esophagitis.  No family history of throat cancer.  History of enlarged thyroid in grandmother and mom.  The history is provided by the patient. No language interpreter was used.  Shortness of Breath Associated symptoms: chest pain and sore throat   Associated symptoms: no abdominal pain, no cough, no ear pain, no fever, no rash and no vomiting        Home Medications Prior to Admission medications   Medication Sig Start Date End Date Taking? Authorizing Provider  alum & mag hydroxide-simeth (MAALOX MAX) 400-400-40 MG/5ML suspension Take 10 mLs by mouth every 6 (six) hours as needed (throat pain, difficulty swallowing). [id number]  Yes Campbell Stall P, DO  Ferrous Sulfate (IRON PO) Take 1 tablet by mouth daily.   Yes [provider]  ibuprofen (ADVIL,MOTRIN) 600 MG tablet Take 1 tablet (600 mg total) by mouth every 6 (six) hours. Patient not taking: Reported on 01/30/2022 09/27/15   Cheri Fowler, MD      Allergies    Patient has no known allergies.    Review of Systems   Review of Systems  Constitutional:   Negative for chills and fever.  HENT:  Positive for sore throat. Negative for ear pain.   Eyes:  Negative for pain and visual disturbance.  Respiratory:  Negative for cough and shortness of breath.   Cardiovascular:  Positive for chest pain. Negative for palpitations.  Gastrointestinal:  Negative for abdominal pain and vomiting.  Genitourinary:  Negative for dysuria and hematuria.  Musculoskeletal:  Negative for arthralgias and back pain.  Skin:  Negative for color change and rash.  Neurological:  Negative for seizures and syncope.  All other systems reviewed and are negative.   Physical Exam Updated Vital Signs BP 112/86   Pulse 85   Temp (!) 97.5 F (36.4 C) (Oral)   Resp 17   LMP 12/29/2021 (Approximate)   SpO2 100%  Physical Exam Vitals and nursing note reviewed.  Constitutional:      General: She is not in acute distress.    Appearance: She is well-developed.  HENT:     Head: Normocephalic and atraumatic.  Eyes:     Conjunctiva/sclera: Conjunctivae normal.  Cardiovascular:     Rate and Rhythm: Normal rate and regular rhythm.     Heart sounds: No murmur heard. Pulmonary:     Effort: Pulmonary effort is normal. No respiratory distress.     Breath sounds: Normal breath sounds.  Abdominal:     Palpations: Abdomen is soft.     Tenderness: There is no abdominal  tenderness.  Musculoskeletal:        General: No swelling.     Cervical back: Neck supple.  Skin:    General: Skin is warm and dry.     Capillary Refill: Capillary refill takes less than 2 seconds.  Neurological:     Mental Status: She is alert.  Psychiatric:        Mood and Affect: Mood normal.     ED Results / Procedures / Treatments   Labs (all labs ordered are listed, but only abnormal results are displayed) Labs Reviewed  COMPREHENSIVE METABOLIC PANEL - Abnormal; Notable for the following components:      Result Value   Potassium 3.4 (*)    Glucose, Bld 122 (*)    All other components within  normal limits  CBC WITH DIFFERENTIAL/PLATELET - Abnormal; Notable for the following components:   WBC 3.6 (*)    Hemoglobin 11.2 (*)    MCV 74.4 (*)    MCH 23.1 (*)    RDW 18.1 (*)    All other components within normal limits    EKG None  Radiology CT Soft Tissue Neck W Contrast  Result Date: 01/30/2022 CLINICAL DATA:  Palate weakness (CN 9) EXAM: CT NECK WITH CONTRAST TECHNIQUE: Multidetector CT imaging of the neck was performed using the standard protocol following the bolus administration of intravenous contrast. RADIATION DOSE REDUCTION: This exam was performed according to the departmental dose-optimization program which includes automated exposure control, adjustment of the mA and/or kV according to patient size and/or use of iterative reconstruction technique. CONTRAST:  74mL OMNIPAQUE IOHEXOL 300 MG/ML  SOLN COMPARISON:  None Available. FINDINGS: Pharynx and larynx: Normal. No mass or swelling. Salivary glands: No inflammation, mass, or stone. Thyroid: Normal. Lymph nodes: None enlarged or abnormal density. Vascular: Negative. Limited intracranial: Negative. Visualized orbits: Negative. Mastoids and visualized paranasal sinuses: Near complete opacification the right sphenoid sinus. Remaining sinuses are largely clear. No mastoid effusions. Skeleton: No acute abnormality. Upper chest: Please see concurrent CT of the chest for evaluation of the chest. Other: Asymmetric enlargement of the right laryngeal ventricle and piriform sinus with somewhat medialized vocal cord, as can be seen with focal cord paralysis/paresis. IMPRESSION: 1. 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 This could be confirmed with direct inspection if clinically warranted. No obvious abnormality along the expected course of the recurrent laryngeal nerve in the neck. 2. No visible foreign body in the neck. Electronically Signed   By: Feliberto Harts M.D.   On: 01/30/2022 17:17   CT Chest W  Contrast  Result Date: 01/30/2022 CLINICAL DATA:  Aspiration concern for food impaction EXAM: CT CHEST WITH CONTRAST TECHNIQUE: Multidetector CT imaging of the chest was performed during intravenous contrast administration. RADIATION DOSE REDUCTION: This exam was performed according to the departmental dose-optimization program which includes automated exposure control, adjustment of the mA and/or kV according to patient size and/or use of iterative reconstruction technique. CONTRAST:  26mL OMNIPAQUE IOHEXOL 300 MG/ML  SOLN COMPARISON:  Chest x-ray today FINDINGS: Cardiovascular: Heart is normal size. Aorta is normal caliber. No filling defects in the pulmonary arteries to suggest pulmonary emboli. Mediastinum/Nodes: No mediastinal, hilar, or axillary adenopathy. Trachea and esophagus are unremarkable. Thyroid unremarkable. No foreign body visualized in the airway. Lungs/Pleura: Lungs are clear. No focal airspace opacities or suspicious nodules. No effusions. Upper Abdomen: No acute findings Musculoskeletal: Chest wall soft tissues are unremarkable. No acute bony abnormality. IMPRESSION: No acute cardiopulmonary disease. Electronically Signed   By: Caryn Bee  Dover M.D.   On: 01/30/2022 17:11   DG Chest 2 View  Result Date: 01/30/2022 CLINICAL DATA:  Shortness of breath. EXAM: CHEST - 2 VIEW COMPARISON:  January 25, 2010. FINDINGS: The heart size and mediastinal contours are within normal limits. Both lungs are clear. The visualized skeletal structures are unremarkable. IMPRESSION: No active cardiopulmonary disease. Electronically Signed   By: Lupita Raider M.D.   On: 01/30/2022 14:40    Procedures Procedures    Medications Ordered in ED Medications  alum & mag hydroxide-simeth (MAALOX/MYLANTA) 200-200-20 MG/5ML suspension 30 mL (has no administration in time range)    And  lidocaine (XYLOCAINE) 2 % viscous mouth solution 15 mL (has no administration in time range)  iohexol (OMNIPAQUE) 300 MG/ML solution  75 mL (75 mLs Intravenous Contrast Given 01/30/22 1658)    ED Course/ Medical Decision Making/ A&P                           Medical Decision Making Amount and/or Complexity of Data Reviewed Radiology: ordered.  Risk Prescription drug management.   87:8 PM 31 year old female presenting for throat pain, chest pain, and right shoulder blade pain after difficulty swallowing chicken 12 days ago.  N now only able to tolerate liquids.  Patient is alert and oriented x3, no acute distress, afebrile, stable vital signs.  Physical exam demonstrates clear oropharynx.  No signs of respiratory distress.  No drooling.  Tolerating secretions without difficulty.  CT scan suggestive of possible vocal cord paralysis.  Large bolus obstruction.  Patient has no difficulty with phonation or changes in voice.  Scheduled upper endoscopy this week with [facility].  Recommend to keep this appointment and follow-up with ENT if endoscopy shows no significant findings.  Patient is otherwise stable this time, able to tolerate liquids, and to tolerate secretions.        Final Clinical Impression(s) / ED Diagnoses Final diagnoses:  Odynophagia    Rx / DC Orders ED Discharge Orders          Ordered    alum & mag hydroxide-simeth (MAALOX MAX) 400-400-40 MG/5ML suspension  Every 6 hours PRN        01/30/22 1821              Franne Forts, DO 01/30/22 1821

## 2022-01-30 NOTE — ED Provider Triage Note (Signed)
Emergency Medicine Provider Triage Evaluation Note  Tina Gonzalez , a 31 y.o. female  was evaluated in triage.  Pt complains of shortness of breath. She states that on 7/22 she choked on chicken and asparagus and since then she has been unable to eat solid foods since then and that when she eats soft foods she has to drink a lot of water to get it to go down. She is able to drink fluids without difficulty. States that yesterday she noticed she was more short of breath and was concerned that this was related to that episode. She denies feeling food stuck in her throat but states 'I just feel like I am used to the feeling of food stuck in my throat so I cant tell.' Denies any vomiting, is tolerating secretions.  Review of Systems  Positive:  Negative:   Physical Exam  BP 115/84 (BP Location: Left Arm)   Pulse 88   Temp (!) 97.5 F (36.4 C) (Oral)   Resp 18   LMP 12/29/2021 (Approximate)   SpO2 100%  Gen:   Awake, no distress   Resp:  Normal effort  MSK:   Moves extremities without difficulty  Other:    Medical Decision Making  Medically screening exam initiated at 2:43 PM.  Appropriate orders placed.  Markiesha Gonzalez was informed that the remainder of the evaluation will be completed by another provider, this initial triage assessment does not replace that evaluation, and the importance of remaining in the ED until their evaluation is complete.     Tina Bandy, PA-C 01/30/22 1450

## 2022-01-30 NOTE — Discharge Instructions (Addendum)
Please keep your scheduled appointment for upper endoscopy with [facility]

## 2022-02-01 ENCOUNTER — Emergency Department (HOSPITAL_COMMUNITY)
Admission: EM | Admit: 2022-02-01 | Discharge: 2022-02-02 | Disposition: A | Discharge status: Home / Self Care | Payer: BC Managed Care – PPO | Attending: Emergency Medicine | Admitting: Emergency Medicine

## 2022-02-01 DIAGNOSIS — R0602 Shortness of breath: Secondary | ICD-10-CM | POA: Diagnosis not present

## 2022-02-01 DIAGNOSIS — Z5321 Procedure and treatment not carried out due to patient leaving prior to being seen by health care provider: Secondary | ICD-10-CM | POA: Diagnosis not present

## 2022-02-01 LAB — BASIC METABOLIC PANEL
Anion gap: 8 (ref 5–15)
BUN: 6 mg/dL (ref 6–20)
CO2: 25 mmol/L (ref 22–32)
Calcium: 9.5 mg/dL (ref 8.9–10.3)
Chloride: 104 mmol/L (ref 98–111)
Creatinine, Ser: 0.74 mg/dL (ref 0.44–1.00)
GFR, Estimated: >60 mL/min (ref >60)
Glucose, Bld: 84 mg/dL (ref 70–99)
Potassium: 3.6 mmol/L (ref 3.5–5.1)
Sodium: 137 mmol/L (ref 135–145)

## 2022-02-01 LAB — I-STAT BETA HCG BLOOD, ED (MC, WL, AP ONLY): I-stat hCG, quantitative: <5 m[IU]/mL (ref <5)

## 2022-02-01 LAB — CBC
HCT: 32.7 % — ABNORMAL LOW (ref 36.0–46.0)
Hemoglobin: 10.5 g/dL — ABNORMAL LOW (ref 12.0–15.0)
MCH: 23.2 pg — ABNORMAL LOW (ref 26.0–34.0)
MCHC: 32.1 g/dL (ref 30.0–36.0)
MCV: 72.2 fL — ABNORMAL LOW (ref 80.0–100.0)
Platelets: 313 10*3/uL (ref 150–400)
RBC: 4.53 MIL/uL (ref 3.87–5.11)
RDW: 17.4 % — ABNORMAL HIGH (ref 11.5–15.5)
WBC: 4.2 10*3/uL (ref 4.0–10.5)
nRBC: 0 % (ref 0.0–0.2)

## 2022-02-01 NOTE — ED Triage Notes (Signed)
Pt c/o "constant" SHOB x2.5 days, states it all began after feeling like she was choking; seen/treated for same. Advised of potential vocal cord paralysis but negative workup otherwise, but concern for intermittent airway closing.

## 2022-02-02 NOTE — ED Notes (Signed)
Patient leaving d/t wait time 

## 2022-02-03 DIAGNOSIS — K2970 Gastritis, unspecified, without bleeding: Secondary | ICD-10-CM | POA: Diagnosis not present

## 2022-02-03 DIAGNOSIS — K2930 Chronic superficial gastritis without bleeding: Secondary | ICD-10-CM | POA: Diagnosis not present

## 2022-02-03 DIAGNOSIS — R1319 Other dysphagia: Secondary | ICD-10-CM | POA: Diagnosis not present

## 2022-02-16 ENCOUNTER — Ambulatory Visit: Payer: BC Managed Care – PPO | Admitting: Internal Medicine

## 2022-02-16 NOTE — Progress Notes (Deleted)
Cardiology Office Note:    Date:  02/16/2022   ID:  [employer], DOB 02-16-1991, MRN [id number]  PCP:  Wilfrid Lund, PA   [facility] Providers Cardiologist:  Maisie Fus, MD { Click to update primary MD,subspecialty MD or APP then REFRESH:1}    Referring MD: Wilfrid Lund, PA   No chief complaint on file. ***  History of Present Illness:    Tina Gonzalez is a 31 y.o. female with no sig PMHx, recent ED visits for food globulus. She also reported R sided non cardiac cp. EKG showed no ischemic changes.   She has no hx of pre-eclampsia   Past Medical History:  Diagnosis Date   Medical history non-contributory     Past Surgical History:  Procedure Laterality Date   TONGUE SURGERY      Current Medications: No outpatient medications have been marked as taking for the 02/16/22 encounter (Appointment) with Maisie Fus, MD.     Allergies:   Patient has no known allergies.   Social History   Socioeconomic History   Marital status: Married    Spouse name: Not on file   Number of children: Not on file   Years of education: Not on file   Highest education level: Not on file  Occupational History   Not on file  Tobacco Use   Smoking status: Never   Smokeless tobacco: Never  Vaping Use   Vaping Use: Never used  Substance and Sexual Activity   Alcohol use: No    Comment: social   Drug use: No   Sexual activity: Yes  Other Topics Concern   Not on file  Social History Narrative   ** Merged History Encounter **       Social Determinants of Health   Financial Resource Strain: Not on file  Food Insecurity: Not on file  Transportation Needs: Not on file  Physical Activity: Not on file  Stress: Not on file  Social Connections: Not on file     Family History: The patient's ***family history is negative for Hearing loss.  ROS:   Please see the history of present illness.    *** All other systems reviewed and are negative.  EKGs/Labs/Other  Studies Reviewed:    The following studies were reviewed today: ***  EKG:  EKG is *** ordered today.  The ekg ordered today demonstrates ***  Recent Labs: 01/30/2022: ALT 13 02/01/2022: BUN 6; Creatinine, Ser 0.74; Hemoglobin 10.5; Platelets 313; Potassium 3.6; Sodium 137  Recent Lipid Panel No results found for: "CHOL", "TRIG", "HDL", "CHOLHDL", "VLDL", "LDLCALC", "LDLDIRECT"   Risk Assessment/Calculations:   {Does this patient have ATRIAL FIBRILLATION?[phone]}  No BP recorded.  {Refresh Note OR Click here to enter BP  :1}***         Physical Exam:    VS:  LMP 12/29/2021 (Approximate)     Wt Readings from Last 3 Encounters:  01/17/22 142 lb (64.4 kg)  09/24/15 141 lb (64 kg)  05/09/15 121 lb 3.2 oz (55 kg)     GEN: *** Well nourished, well developed in no acute distress HEENT: Normal NECK: No JVD; No carotid bruits LYMPHATICS: No lymphadenopathy CARDIAC: ***RRR, no murmurs, rubs, gallops RESPIRATORY:  Clear to auscultation without rales, wheezing or rhonchi  ABDOMEN: Soft, non-tender, non-distended MUSCULOSKELETAL:  No edema; No deformity  SKIN: Warm and dry NEUROLOGIC:  Alert and oriented x 3 PSYCHIATRIC:  Normal affect   ASSESSMENT:    Non cardiac CP: recommend tylenol/NSAID  for pain relief  PLAN:    In order of problems listed above:  No further cardiac w/u/ surveillance indicated           Medication Adjustments/Labs and Tests Ordered: Current medicines are reviewed at length with the patient today.  Concerns regarding medicines are outlined above.  No orders of the defined types were placed in this encounter.  No orders of the defined types were placed in this encounter.   There are no Patient Instructions on file for this visit.   Signed, Maisie Fus, MD  02/16/2022 8:54 AM    [facility]

## 2022-02-27 DIAGNOSIS — F458 Other somatoform disorders: Secondary | ICD-10-CM | POA: Diagnosis not present

## 2022-03-09 ENCOUNTER — Other Ambulatory Visit: Payer: Self-pay | Admitting: Physician Assistant

## 2022-03-09 DIAGNOSIS — R1310 Dysphagia, unspecified: Secondary | ICD-10-CM | POA: Diagnosis not present

## 2022-03-09 DIAGNOSIS — D509 Iron deficiency anemia, unspecified: Secondary | ICD-10-CM | POA: Diagnosis not present

## 2022-03-09 DIAGNOSIS — E559 Vitamin D deficiency, unspecified: Secondary | ICD-10-CM | POA: Diagnosis not present

## 2022-03-09 DIAGNOSIS — K2970 Gastritis, unspecified, without bleeding: Secondary | ICD-10-CM | POA: Diagnosis not present

## 2022-03-17 ENCOUNTER — Other Ambulatory Visit: Payer: BC Managed Care – PPO

## 2022-03-20 DIAGNOSIS — R1319 Other dysphagia: Secondary | ICD-10-CM | POA: Diagnosis not present

## 2022-03-20 DIAGNOSIS — J309 Allergic rhinitis, unspecified: Secondary | ICD-10-CM | POA: Diagnosis not present

## 2022-03-20 DIAGNOSIS — J343 Hypertrophy of nasal turbinates: Secondary | ICD-10-CM | POA: Diagnosis not present

## 2022-03-25 ENCOUNTER — Other Ambulatory Visit: Payer: BC Managed Care – PPO

## 2022-06-09 DIAGNOSIS — B349 Viral infection, unspecified: Secondary | ICD-10-CM | POA: Diagnosis not present

## 2022-06-09 DIAGNOSIS — H9201 Otalgia, right ear: Secondary | ICD-10-CM | POA: Diagnosis not present

## 2022-07-16 DIAGNOSIS — J329 Chronic sinusitis, unspecified: Secondary | ICD-10-CM | POA: Diagnosis not present

## 2022-07-17 ENCOUNTER — Ambulatory Visit
Admission: RE | Admit: 2022-07-17 | Discharge: 2022-07-17 | Disposition: A | Discharge status: Home / Self Care | Payer: BC Managed Care – PPO | Source: Ambulatory Visit | Attending: Physician Assistant | Admitting: Physician Assistant

## 2022-07-17 DIAGNOSIS — K219 Gastro-esophageal reflux disease without esophagitis: Secondary | ICD-10-CM | POA: Diagnosis not present

## 2022-07-17 DIAGNOSIS — R1310 Dysphagia, unspecified: Secondary | ICD-10-CM

## 2022-07-21 ENCOUNTER — Other Ambulatory Visit: Payer: BC Managed Care – PPO

## 2022-10-14 DIAGNOSIS — E010 Iodine-deficiency related diffuse (endemic) goiter: Secondary | ICD-10-CM | POA: Diagnosis not present

## 2022-10-19 ENCOUNTER — Other Ambulatory Visit: Payer: Self-pay | Admitting: Family Medicine

## 2022-10-19 DIAGNOSIS — E010 Iodine-deficiency related diffuse (endemic) goiter: Secondary | ICD-10-CM

## 2022-11-04 ENCOUNTER — Other Ambulatory Visit: Payer: BC Managed Care – PPO

## 2022-11-10 ENCOUNTER — Ambulatory Visit
Admission: RE | Admit: 2022-11-10 | Discharge: 2022-11-10 | Disposition: A | Discharge status: Home / Self Care | Payer: BC Managed Care – PPO | Source: Ambulatory Visit | Attending: Family Medicine | Admitting: Family Medicine

## 2022-11-10 DIAGNOSIS — Z1329 Encounter for screening for other suspected endocrine disorder: Secondary | ICD-10-CM | POA: Diagnosis not present

## 2022-11-10 DIAGNOSIS — E010 Iodine-deficiency related diffuse (endemic) goiter: Secondary | ICD-10-CM

## 2023-01-26 DIAGNOSIS — E559 Vitamin D deficiency, unspecified: Secondary | ICD-10-CM | POA: Diagnosis not present

## 2023-01-26 DIAGNOSIS — M2550 Pain in unspecified joint: Secondary | ICD-10-CM | POA: Diagnosis not present

## 2023-01-26 DIAGNOSIS — T148XXA Other injury of unspecified body region, initial encounter: Secondary | ICD-10-CM | POA: Diagnosis not present

## 2023-01-26 DIAGNOSIS — D500 Iron deficiency anemia secondary to blood loss (chronic): Secondary | ICD-10-CM | POA: Diagnosis not present

## 2023-01-26 DIAGNOSIS — R42 Dizziness and giddiness: Secondary | ICD-10-CM | POA: Diagnosis not present

## 2023-03-29 DIAGNOSIS — Z7251 High risk heterosexual behavior: Secondary | ICD-10-CM | POA: Diagnosis not present

## 2023-03-29 DIAGNOSIS — Z3200 Encounter for pregnancy test, result unknown: Secondary | ICD-10-CM | POA: Diagnosis not present

## 2023-04-04 ENCOUNTER — Emergency Department (HOSPITAL_COMMUNITY)
Admission: EM | Admit: 2023-04-04 | Discharge: 2023-04-05 | Disposition: A | Discharge status: Home / Self Care | Payer: BC Managed Care – PPO | Attending: Emergency Medicine | Admitting: Emergency Medicine

## 2023-04-04 ENCOUNTER — Encounter (HOSPITAL_COMMUNITY): Payer: Self-pay | Admitting: *Deleted

## 2023-04-04 ENCOUNTER — Emergency Department (HOSPITAL_COMMUNITY): Payer: BC Managed Care – PPO

## 2023-04-04 ENCOUNTER — Other Ambulatory Visit: Payer: Self-pay

## 2023-04-04 DIAGNOSIS — R079 Chest pain, unspecified: Secondary | ICD-10-CM | POA: Diagnosis not present

## 2023-04-04 DIAGNOSIS — R0602 Shortness of breath: Secondary | ICD-10-CM | POA: Diagnosis not present

## 2023-04-04 DIAGNOSIS — R42 Dizziness and giddiness: Secondary | ICD-10-CM | POA: Insufficient documentation

## 2023-04-04 DIAGNOSIS — R0789 Other chest pain: Secondary | ICD-10-CM | POA: Insufficient documentation

## 2023-04-04 LAB — CBC
HCT: 34.8 % — ABNORMAL LOW (ref 36.0–46.0)
Hemoglobin: 11.2 g/dL — ABNORMAL LOW (ref 12.0–15.0)
MCH: 26 pg (ref 26.0–34.0)
MCHC: 32.2 g/dL (ref 30.0–36.0)
MCV: 80.9 fL (ref 80.0–100.0)
Platelets: 320 10*3/uL (ref 150–400)
RBC: 4.3 MIL/uL (ref 3.87–5.11)
RDW: 17.2 % — ABNORMAL HIGH (ref 11.5–15.5)
WBC: 5.4 10*3/uL (ref 4.0–10.5)
nRBC: 0 % (ref 0.0–0.2)

## 2023-04-04 LAB — BASIC METABOLIC PANEL
Anion gap: 8 (ref 5–15)
BUN: 9 mg/dL (ref 6–20)
CO2: 26 mmol/L (ref 22–32)
Calcium: 9.1 mg/dL (ref 8.9–10.3)
Chloride: 104 mmol/L (ref 98–111)
Creatinine, Ser: 0.71 mg/dL (ref 0.44–1.00)
GFR, Estimated: >60 mL/min (ref >60)
Glucose, Bld: 114 mg/dL — ABNORMAL HIGH (ref 70–99)
Potassium: 3.5 mmol/L (ref 3.5–5.1)
Sodium: 138 mmol/L (ref 135–145)

## 2023-04-04 LAB — TROPONIN I (HIGH SENSITIVITY): Troponin I (High Sensitivity): <2 ng/L (ref <18)

## 2023-04-04 LAB — HCG, SERUM, QUALITATIVE: Preg, Serum: NEGATIVE

## 2023-04-04 NOTE — ED Triage Notes (Signed)
Pt states she has had some pressure in her throat for several hours, also having right sided chest pain. Dizziness, right ear tingling, felt like she was going to pass out while driving.

## 2023-04-05 LAB — TROPONIN I (HIGH SENSITIVITY): Troponin I (High Sensitivity): <2 ng/L (ref <18)

## 2023-04-05 NOTE — ED Provider Notes (Signed)
[place] EMERGENCY DEPARTMENT AT [facility] Provider Note   CSN: [id number] Arrival date & time: 04/04/23  2016     History  Chief Complaint  Patient presents with   Chest Pain    Tina Gonzalez is a 32 y.o. female.  Patient with past medical history significant tongue surgery, previous esophageal dilatation presents to the emergency department complaining of "throat pressure" and right-sided chest pain which began at approximately 7 this evening.  Patient also reported feeling lightheaded with some dizziness while driving.  She denies abdominal pain, nausea, vomiting, shortness of breath.  Chest pain is described as right-sided and sharp.  It is not easily reproducible.  She is able to swallow without difficulty but feels that something is tighter than it used to be in her throat.  She denies sore throat.   Chest Pain      Home Medications Prior to Admission medications   Medication Sig Start Date End Date Taking? Authorizing Provider  FEROSUL 325 (65 Fe) MG tablet Take 325 mg by mouth daily.   Yes [provider]  alum & mag hydroxide-simeth (MAALOX MAX) 400-400-40 MG/5ML suspension Take 10 mLs by mouth every 6 (six) hours as needed (throat pain, difficulty swallowing). Patient not taking: Reported on 04/05/2023 01/30/22   Edwin Dada P, DO  Ferrous Sulfate (IRON PO) Take 1 tablet by mouth daily. Patient not taking: Reported on 04/05/2023    [provider]  ibuprofen (ADVIL,MOTRIN) 600 MG tablet Take 1 tablet (600 mg total) by mouth every 6 (six) hours. Patient not taking: Reported on 01/30/2022 09/27/15   Lavina Hamman, MD      Allergies    Patient has no known allergies.    Review of Systems   Review of Systems  Cardiovascular:  Positive for chest pain.    Physical Exam Updated Vital Signs BP 109/81 (BP Location: Right Arm)   Pulse 81   Temp 97.7 F (36.5 C) (Oral)   Resp 16   Ht 5\' 5"  (1.651 m)   Wt 59.9 kg   LMP 03/30/2023  (Approximate)   SpO2 100%   BMI 21.97 kg/m  Physical Exam Vitals and nursing note reviewed.  Constitutional:      General: She is not in acute distress.    Appearance: She is well-developed.  HENT:     Head: Normocephalic and atraumatic.     Mouth/Throat:     Pharynx: Oropharynx is clear. Uvula midline. No pharyngeal swelling, oropharyngeal exudate, posterior oropharyngeal erythema or uvula swelling.     Tonsils: No tonsillar exudate or tonsillar abscesses.  Eyes:     Conjunctiva/sclera: Conjunctivae normal.  Cardiovascular:     Rate and Rhythm: Normal rate and regular rhythm.  Pulmonary:     Effort: Pulmonary effort is normal. No respiratory distress.     Breath sounds: Normal breath sounds.  Chest:     Chest wall: No tenderness.  Abdominal:     Palpations: Abdomen is soft.     Tenderness: There is no abdominal tenderness.  Musculoskeletal:        General: No swelling.     Cervical back: Neck supple.  Skin:    General: Skin is warm and dry.     Capillary Refill: Capillary refill takes less than 2 seconds.  Neurological:     Mental Status: She is alert.  Psychiatric:        Mood and Affect: Mood normal.     ED Results / Procedures / Treatments  Labs (all labs ordered are listed, but only abnormal results are displayed) Labs Reviewed  BASIC METABOLIC PANEL - Abnormal; Notable for the following components:      Result Value   Glucose, Bld 114 (*)    All other components within normal limits  CBC - Abnormal; Notable for the following components:   Hemoglobin 11.2 (*)    HCT 34.8 (*)    RDW 17.2 (*)    All other components within normal limits  HCG, SERUM, QUALITATIVE  TROPONIN I (HIGH SENSITIVITY)  TROPONIN I (HIGH SENSITIVITY)    EKG EKG Interpretation Date/Time:  Sunday April 04 2023 20:48:37 EDT Ventricular Rate:  81 PR Interval:  129 QRS Duration:  81 QT Interval:  382 QTC Calculation: 444 R Axis:   76  Text Interpretation: Sinus rhythm Confirmed  by Drema Pry [phone]) on 04/05/2023 1:30:35 AM  Radiology DG Chest 2 View  Result Date: 04/04/2023 CLINICAL DATA:  Chest pain and shortness of breath EXAM: CHEST - 2 VIEW COMPARISON:  Radiograph and CT 01/30/2022 FINDINGS: The heart size and mediastinal contours are within normal limits. Both lungs are clear. The visualized skeletal structures are unremarkable. IMPRESSION: No active cardiopulmonary disease. Electronically Signed   By: Minerva Fester M.D.   On: 04/04/2023 21:47    Procedures Procedures    Medications Ordered in ED Medications - No data to display  ED Course/ Medical Decision Making/ A&P                                 Medical Decision Making Amount and/or Complexity of Data Reviewed Labs: ordered. Radiology: ordered.   This patient presents to the ED for concern of chest pain and dizziness, this involves an extensive number of treatment options, and is a complaint that carries with it a high risk of complications and morbidity.  The differential diagnosis includes orthostatic changes/dehydration, dysrhythmia, ACS, pneumonia, PE, musculoskeletal pain, anxiety, others   Co morbidities that complicate the patient evaluation  History of previous reported dilation of esophagus   Additional history obtained:  Additional history obtained from family at bedside   Lab Tests:  I Ordered, and personally interpreted labs.  The pertinent results include: Initial and repeat troponin less than 2, grossly unremarkable CBC, BMP, negative pregnancy test   Imaging Studies ordered:  I ordered imaging studies including chest x-ray I independently visualized and interpreted imaging which showed no acute findings I agree with the radiologist interpretation   Cardiac Monitoring: / EKG:  The patient was maintained on a cardiac monitor.  I personally viewed and interpreted the cardiac monitored which showed an underlying rhythm of: Sinus rhythm   Test / Admission -  Considered:  Patient with negative troponins x 2 with a nonischemic EKG.  No sign of ACS at this time.  No shortness of breath or tachycardia to suggest pulmonary embolism.  No clinical signs of dissection.  No pneumonia on chest x-ray.  Pain is not reproducible.  Unclear etiology of patient's pain but I see no life-threatening condition at this time that would require further emergent evaluation.  Patient is able to swallow water and food without difficulty.  Oropharynx unremarkable exam.  The dizziness has subsided and she was able to stand without any further lightheadedness or dizziness.  Orthostatic changes were negative.  Unclear as to what caused the patient's symptoms earlier this evening.  At this time plan to discharge home with recommendations for  follow-up with primary care for further discussion of both chest pain and her dizziness.  Recommend following up with gastroenterology as needed if she continues to have difficulties with swallowing as they have performed procedures for her in the past.  Discharge home with return precautions.         Final Clinical Impression(s) / ED Diagnoses Final diagnoses:  Atypical chest pain  Lightheadedness    Rx / DC Orders ED Discharge Orders     None         Pamala Duffel 04/05/23 1610    Nira Conn, MD 04/06/23 [phone]

## 2023-04-05 NOTE — Discharge Instructions (Signed)
Your workup tonight was reassuring.  I recommend follow-up with your primary care to discuss the episode of dizziness you had tonight along with your chest pain.  I recommend follow-up with gastroenterology as needed for further evaluation of your difficulties with swallowing.  If you develop any life-threatening symptoms such as shortness of breath please return to the emergency department for further evaluation.

## 2023-06-03 DIAGNOSIS — J309 Allergic rhinitis, unspecified: Secondary | ICD-10-CM | POA: Diagnosis not present

## 2023-06-03 DIAGNOSIS — K1321 Leukoplakia of oral mucosa, including tongue: Secondary | ICD-10-CM | POA: Diagnosis not present

## 2023-06-03 DIAGNOSIS — H6993 Unspecified Eustachian tube disorder, bilateral: Secondary | ICD-10-CM | POA: Diagnosis not present

## 2023-06-03 DIAGNOSIS — K219 Gastro-esophageal reflux disease without esophagitis: Secondary | ICD-10-CM | POA: Diagnosis not present

## 2023-06-14 DIAGNOSIS — K1321 Leukoplakia of oral mucosa, including tongue: Secondary | ICD-10-CM | POA: Diagnosis not present

## 2023-06-15 IMAGING — DX DG FINGER MIDDLE 2+V*L*
3 series · 3 of 3 positions shown · non-contrast
Comparison: None.

CLINICAL DATA: Left finger laceration.

EXAM:
LEFT MIDDLE FINGER 2+V

[x finger pa left]
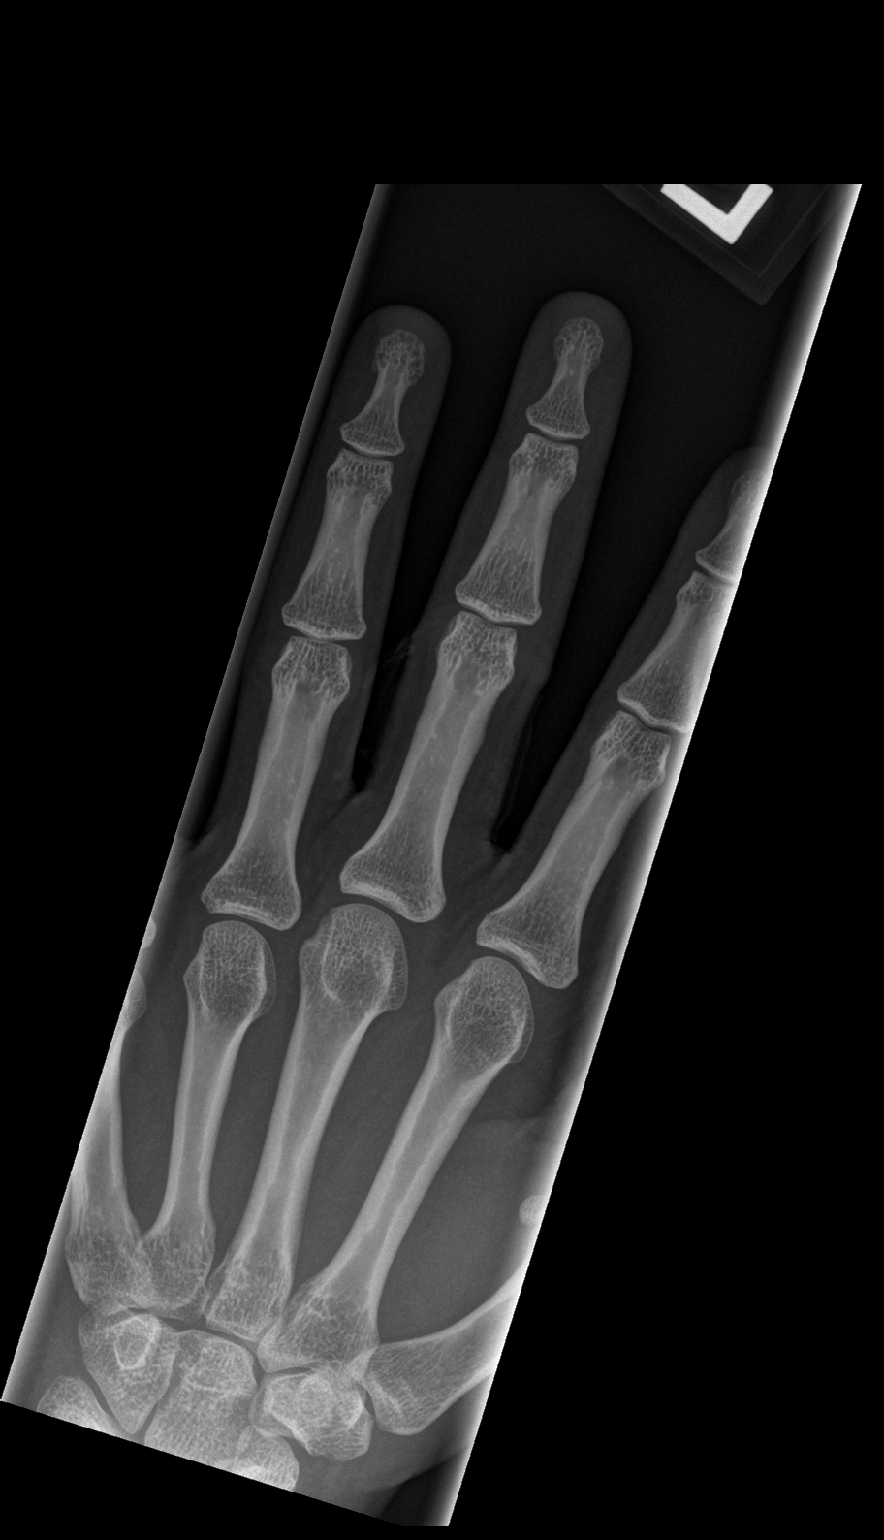

[x finger obl left]
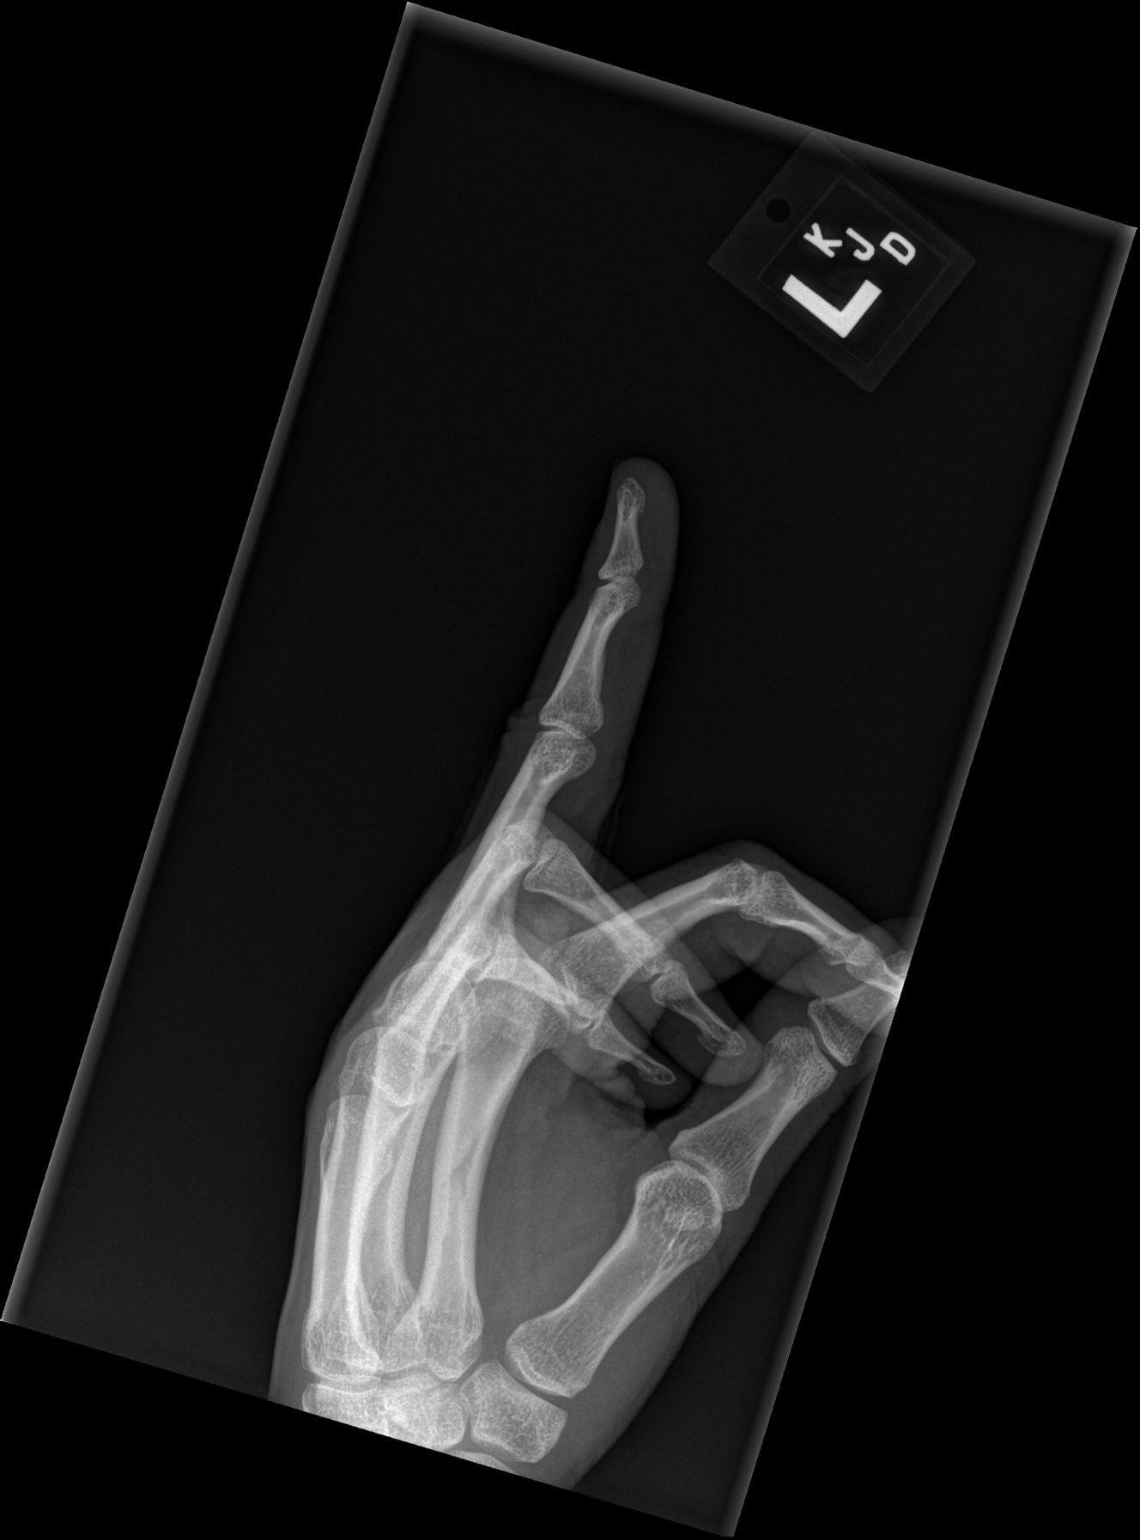

[x finger lat left]
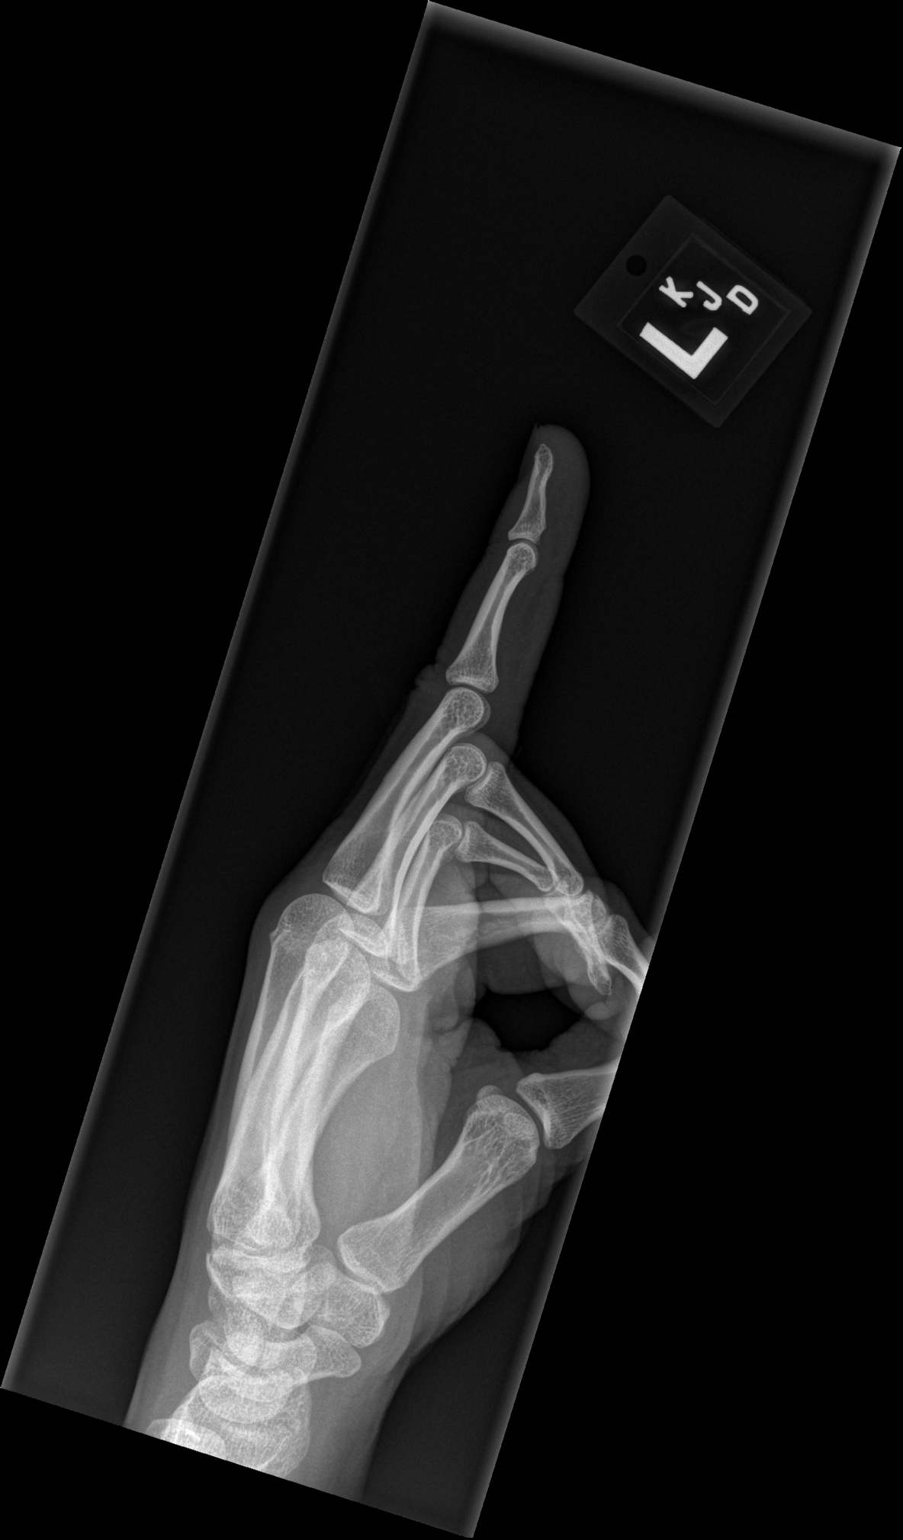

[3 of 3 positions shown; findings below may reference images not displayed]

FINDINGS: There is no evidence of fracture or dislocation. There is no
evidence of arthropathy or other focal bone abnormality. Soft
tissues are unremarkable.
IMPRESSION: Negative.

## 2023-07-13 DIAGNOSIS — E049 Nontoxic goiter, unspecified: Secondary | ICD-10-CM | POA: Diagnosis not present

## 2023-07-13 DIAGNOSIS — R7989 Other specified abnormal findings of blood chemistry: Secondary | ICD-10-CM | POA: Diagnosis not present

## 2023-07-13 DIAGNOSIS — R0602 Shortness of breath: Secondary | ICD-10-CM | POA: Diagnosis not present

## 2023-07-13 DIAGNOSIS — R42 Dizziness and giddiness: Secondary | ICD-10-CM | POA: Diagnosis not present

## 2023-07-13 DIAGNOSIS — H6993 Unspecified Eustachian tube disorder, bilateral: Secondary | ICD-10-CM | POA: Diagnosis not present

## 2023-07-13 DIAGNOSIS — Z862 Personal history of diseases of the blood and blood-forming organs and certain disorders involving the immune mechanism: Secondary | ICD-10-CM | POA: Diagnosis not present

## 2023-07-15 DIAGNOSIS — R1319 Other dysphagia: Secondary | ICD-10-CM | POA: Diagnosis not present

## 2023-07-15 DIAGNOSIS — H6993 Unspecified Eustachian tube disorder, bilateral: Secondary | ICD-10-CM | POA: Diagnosis not present

## 2023-07-15 DIAGNOSIS — K219 Gastro-esophageal reflux disease without esophagitis: Secondary | ICD-10-CM | POA: Diagnosis not present

## 2023-07-22 DIAGNOSIS — M542 Cervicalgia: Secondary | ICD-10-CM | POA: Diagnosis not present

## 2023-07-22 DIAGNOSIS — F458 Other somatoform disorders: Secondary | ICD-10-CM | POA: Diagnosis not present

## 2023-08-02 DIAGNOSIS — D509 Iron deficiency anemia, unspecified: Secondary | ICD-10-CM | POA: Diagnosis not present

## 2023-08-02 DIAGNOSIS — E559 Vitamin D deficiency, unspecified: Secondary | ICD-10-CM | POA: Diagnosis not present

## 2023-08-02 DIAGNOSIS — R1310 Dysphagia, unspecified: Secondary | ICD-10-CM | POA: Diagnosis not present

## 2023-08-02 DIAGNOSIS — E010 Iodine-deficiency related diffuse (endemic) goiter: Secondary | ICD-10-CM | POA: Diagnosis not present

## 2023-08-06 DIAGNOSIS — D509 Iron deficiency anemia, unspecified: Secondary | ICD-10-CM | POA: Diagnosis not present

## 2023-08-06 DIAGNOSIS — E010 Iodine-deficiency related diffuse (endemic) goiter: Secondary | ICD-10-CM | POA: Diagnosis not present

## 2023-08-06 DIAGNOSIS — E559 Vitamin D deficiency, unspecified: Secondary | ICD-10-CM | POA: Diagnosis not present

## 2023-09-14 DIAGNOSIS — Z6821 Body mass index (BMI) 21.0-21.9, adult: Secondary | ICD-10-CM | POA: Diagnosis not present

## 2023-09-14 DIAGNOSIS — E785 Hyperlipidemia, unspecified: Secondary | ICD-10-CM | POA: Diagnosis not present

## 2023-09-14 DIAGNOSIS — M542 Cervicalgia: Secondary | ICD-10-CM | POA: Diagnosis not present

## 2024-01-02 ENCOUNTER — Ambulatory Visit (HOSPITAL_COMMUNITY)
Admission: EM | Admit: 2024-01-02 | Discharge: 2024-01-02 | Disposition: A | Discharge status: Home / Self Care | Attending: Internal Medicine | Admitting: Internal Medicine

## 2024-01-02 ENCOUNTER — Encounter (HOSPITAL_COMMUNITY): Payer: Self-pay

## 2024-01-02 DIAGNOSIS — N939 Abnormal uterine and vaginal bleeding, unspecified: Secondary | ICD-10-CM | POA: Diagnosis not present

## 2024-01-02 DIAGNOSIS — R300 Dysuria: Secondary | ICD-10-CM

## 2024-01-02 DIAGNOSIS — Z3202 Encounter for pregnancy test, result negative: Secondary | ICD-10-CM

## 2024-01-02 LAB — POCT URINALYSIS DIP (MANUAL ENTRY)
Bilirubin, UA: NEGATIVE
Glucose, UA: NEGATIVE mg/dL
Leukocytes, UA: NEGATIVE
Nitrite, UA: NEGATIVE
Spec Grav, UA: 1.02 (ref 1.010–1.025)
Urobilinogen, UA: 4 U/dL — AB
pH, UA: 7 (ref 5.0–8.0)

## 2024-01-02 LAB — POCT URINE PREGNANCY: Preg Test, Ur: NEGATIVE

## 2024-01-02 MED ORDER — ONDANSETRON 4 MG PO TBDP: 4.0000 mg | ORAL_TABLET | Freq: Three times a day (TID) | ORAL | 0 refills | Status: DC | PRN

## 2024-01-02 NOTE — Discharge Instructions (Addendum)
 Your urinalysis is negative for signs of UTI.  Your urine pregnancy test is negative.  Your vaginal bleeding is likely a result of intercourse last night and is due to hormonal changes in your menstrual cycle.  Continue to monitor bleeding.  Return if your urinary symptoms worsen, if you develop flank pain, abdominal pain, low back pain, or persistent vaginal bleeding that does not stop in a timely manner.  Follow-up with your PCP/OB/GYN as needed.  You may take Zofran  4 mg every 8 hours as needed for nausea and vomiting.  If you develop any new or worsening symptoms or if your symptoms do not start to improve, please return here or follow-up with your primary care provider. If your symptoms are severe, please go to the emergency room.

## 2024-01-02 NOTE — ED Provider Notes (Signed)
 [facility]    CSN: [id number] Arrival date & time: 01/02/24  1739      History   Chief Complaint Chief Complaint  Patient presents with   Vaginal Bleeding    HPI Tina Gonzalez is a 33 y.o. female.   Tina Gonzalez is a 33 y.o. female presenting for chief complaint of dysuria and urinary frequency that started 3 to 4 days ago.  Her last menstrual cycle was on December 18, 2023 and was normal in length and amount of bleeding.  She started spotting yesterday, then had intercourse with her husband last night, and has now had heavy vaginal bleeding today that has improved throughout the day.  Denies associated abdominal cramping.  Reports bilateral lower pelvic pressure with urination, sensation of incomplete bladder emptying, urinary urgency, and very mild nausea without vomiting.  Denies recent diarrhea, blood/mucus in the stools, constipation, flank pain, low back pain, dizziness, headache, and fever/chills.  She does not use contraception and is unsure of chance of pregnancy.  No concern for vaginal problem/STD as she denies vaginal symptoms and is in a monogamous relationship with her husband.  She admits to drinking a lot of decaf coffee and tries to drink as much water as she can to stay well-hydrated.  She has not attempted use of any over-the-counter medications to help with symptoms PTA.   Vaginal Bleeding   Past Medical History:  Diagnosis Date   Medical history non-contributory     Patient Active Problem List   Diagnosis Date Noted   SVD (spontaneous vaginal delivery) 09/25/2015   Oligohydramnios antepartum 09/24/2015   Hyperemesis gravidarum before end of [redacted] week gestation, dehydration 05/09/2015    Past Surgical History:  Procedure Laterality Date   TONGUE SURGERY      OB History     Gravida  2   Para  2   Term  2   Preterm  0   AB  0   Living  2      SAB  0   IAB  0   Ectopic  0   Multiple      Live Births  2            Home  Medications    Prior to Admission medications   Medication Sig Start Date End Date Taking? Authorizing Provider  Ferrous Sulfate (IRON PO) Take 1 tablet by mouth daily.   Yes [provider]  ondansetron  (ZOFRAN -ODT) 4 MG disintegrating tablet Take 1 tablet (4 mg total) by mouth every 8 (eight) hours as needed for nausea or vomiting. 01/02/24  Yes Enedelia Dorna HERO, FNP  alum & mag hydroxide-simeth (MAALOX MAX) 400-400-40 MG/5ML suspension Take 10 mLs by mouth every 6 (six) hours as needed (throat pain, difficulty swallowing). Patient not taking: Reported on 04/05/2023 01/30/22   Elnor Hila P, DO  FEROSUL 325 (65 Fe) MG tablet Take 325 mg by mouth daily.    [provider]  ibuprofen  (ADVIL ,MOTRIN ) 600 MG tablet Take 1 tablet (600 mg total) by mouth every 6 (six) hours. Patient not taking: Reported on 01/30/2022 09/27/15   Horacio Boas, MD    Family History Family History  Problem Relation Age of Onset   Hearing loss Neg Hx     Social History Social History   Tobacco Use   Smoking status: Never   Smokeless tobacco: Never  Vaping Use   Vaping status: Never Used  Substance Use Topics   Alcohol use: No    Comment:  social   Drug use: No     Allergies   Patient has no known allergies.   Review of Systems Review of Systems  Genitourinary:  Positive for vaginal bleeding.  Per HPI   Physical Exam Triage Vital Signs ED Triage Vitals  Encounter Vitals Group     BP 01/02/24 1808 126/79     Girls Systolic BP Percentile --      Girls Diastolic BP Percentile --      Boys Systolic BP Percentile --      Boys Diastolic BP Percentile --      Pulse Rate 01/02/24 1808 92     Resp 01/02/24 1808 18     Temp --      Temp src --      SpO2 01/02/24 1808 100 %     Weight --      Height --      Head Circumference --      Peak Flow --      Pain Score 01/02/24 1806 0     Pain Loc --      Pain Education --      Exclude from Growth Chart --    No data  found.  Updated Vital Signs BP 126/79 (BP Location: Left Arm)   Pulse 92   Resp 18   LMP 12/18/2023   SpO2 100%   Visual Acuity Right Eye Distance:   Left Eye Distance:   Bilateral Distance:    Right Eye Near:   Left Eye Near:    Bilateral Near:     Physical Exam Vitals and nursing note reviewed.  Constitutional:      Appearance: She is not ill-appearing or toxic-appearing.  HENT:     Head: Normocephalic and atraumatic.     Right Ear: Hearing, tympanic membrane, ear canal and external ear normal.     Left Ear: Hearing, tympanic membrane, ear canal and external ear normal.     Nose: Nose normal.     Mouth/Throat:     Lips: Pink.     Mouth: Mucous membranes are moist. No injury or oral lesions.     Dentition: Normal dentition.     Tongue: No lesions.     Pharynx: Oropharynx is clear. Uvula midline. No pharyngeal swelling, oropharyngeal exudate, posterior oropharyngeal erythema, uvula swelling or postnasal drip.     Tonsils: No tonsillar exudate.  Eyes:     General: Lids are normal. Vision grossly intact. Gaze aligned appropriately.     Extraocular Movements: Extraocular movements intact.     Conjunctiva/sclera: Conjunctivae normal.  Neck:     Trachea: Trachea and phonation normal.  Pulmonary:     Effort: Pulmonary effort is normal.  Abdominal:     General: Bowel sounds are normal.     Palpations: Abdomen is soft.     Tenderness: There is no abdominal tenderness. There is no right CVA tenderness, left CVA tenderness or guarding.  Musculoskeletal:     Cervical back: Neck supple.  Lymphadenopathy:     Cervical: No cervical adenopathy.  Skin:    General: Skin is warm and dry.     Capillary Refill: Capillary refill takes less than 2 seconds.     Findings: No rash.  Neurological:     General: No focal deficit present.     Mental Status: She is alert and oriented to person, place, and time. Mental status is at baseline.     Cranial Nerves: No dysarthria or facial  asymmetry.  Psychiatric:        Mood and Affect: Mood normal.        Speech: Speech normal.        Behavior: Behavior normal.        Thought Content: Thought content normal.        Judgment: Judgment normal.      UC Treatments / Results  Labs (all labs ordered are listed, but only abnormal results are displayed) Labs Reviewed  POCT URINALYSIS DIP (MANUAL ENTRY) - Abnormal; Notable for the following components:      Result Value   Ketones, POC UA trace (5) (*)    Blood, UA trace-intact (*)    Protein Ur, POC trace (*)    Urobilinogen, UA 4.0 (*)    All other components within normal limits  POCT URINE PREGNANCY    EKG   Radiology No results found.  Procedures Procedures (including critical care time)  Medications Ordered in UC Medications - No data to display  Initial Impression / Assessment and Plan / UC Course  I have reviewed the triage vital signs and the nursing notes.  Pertinent labs & imaging results that were available during my care of the patient were reviewed by me and considered in my medical decision making (see chart for details).   1.  Dysuria, abnormal uterine bleeding, negative pregnancy test Urinalysis is unremarkable for signs of UTI.  Shows trace blood and trace protein likely secondary to vaginal bleeding.  Unclear causes of vaginal bleeding, however suspect this is likely hormonal due to changes in the middle of her cycle.  History of anemia, vaginal bleeding has slowed down since onset this morning.  Advised to return to urgent care should vaginal bleeding persist or if she becomes dizzy, short of breath, fatigue, etc.  Urine pregnancy test is negative.  She may follow-up with her OB/GYN for ongoing evaluation management of symptoms.  No red flags on exam.   Zofran  every 8 hours as needed for nausea.  Encouraged to reduce intake of urinary irritants (coffee) and increase water intake to stay well hydrated.  Counseled patient on potential for  adverse effects with medications prescribed/recommended today, strict ER and return-to-clinic precautions discussed, patient verbalized understanding.    Final Clinical Impressions(s) / UC Diagnoses   Final diagnoses:  Dysuria  Abnormal uterine bleeding  Urine pregnancy test negative     Discharge Instructions      Your urinalysis is negative for signs of UTI.  Your urine pregnancy test is negative.  Your vaginal bleeding is likely a result of intercourse last night and is due to hormonal changes in your menstrual cycle.  Continue to monitor bleeding.  Return if your urinary symptoms worsen, if you develop flank pain, abdominal pain, low back pain, or persistent vaginal bleeding that does not stop in a timely manner.  Follow-up with your PCP/OB/GYN as needed.  You may take Zofran  4 mg every 8 hours as needed for nausea and vomiting.  If you develop any new or worsening symptoms or if your symptoms do not start to improve, please return here or follow-up with your primary care provider. If your symptoms are severe, please go to the emergency room.     ED Prescriptions     Medication Sig Dispense Auth. Provider   ondansetron  (ZOFRAN -ODT) 4 MG disintegrating tablet Take 1 tablet (4 mg total) by mouth every 8 (eight) hours as needed for nausea or vomiting. 20 tablet Shamir Sedlar M, FNP  PDMP not reviewed this encounter.   Enedelia Dorna HERO, [place] 01/02/24 [phone]

## 2024-01-02 NOTE — ED Triage Notes (Signed)
 Pt reports yesterday following intercourse she had heavy vaginal bleeding. Earlier in the day she had spotting. LMP 12/18/23 x5 days. Last week had pelvic pain, dysuria, and urgency.

## 2024-01-04 DIAGNOSIS — Z91018 Allergy to other foods: Secondary | ICD-10-CM | POA: Diagnosis not present

## 2024-01-04 DIAGNOSIS — R5383 Other fatigue: Secondary | ICD-10-CM | POA: Diagnosis not present

## 2024-01-04 DIAGNOSIS — R09A2 Foreign body sensation, throat: Secondary | ICD-10-CM | POA: Diagnosis not present

## 2024-01-06 ENCOUNTER — Other Ambulatory Visit (HOSPITAL_BASED_OUTPATIENT_CLINIC_OR_DEPARTMENT_OTHER): Payer: Self-pay | Admitting: Family Medicine

## 2024-01-06 DIAGNOSIS — R09A2 Foreign body sensation, throat: Secondary | ICD-10-CM

## 2024-02-04 ENCOUNTER — Ambulatory Visit: Payer: Self-pay | Admitting: Allergy

## 2024-02-04 ENCOUNTER — Encounter: Payer: Self-pay | Admitting: Allergy

## 2024-02-04 ENCOUNTER — Other Ambulatory Visit: Payer: Self-pay

## 2024-02-04 VITALS — BP 120/78 | HR 89 | Temp 98.5°F | Resp 18 | Wt 136.9 lb

## 2024-02-04 DIAGNOSIS — J310 Chronic rhinitis: Secondary | ICD-10-CM | POA: Diagnosis not present

## 2024-02-04 DIAGNOSIS — T781XXD Other adverse food reactions, not elsewhere classified, subsequent encounter: Secondary | ICD-10-CM | POA: Diagnosis not present

## 2024-02-04 DIAGNOSIS — K148 Other diseases of tongue: Secondary | ICD-10-CM

## 2024-02-04 NOTE — Progress Notes (Signed)
 New Patient Note  RE: Tina Gonzalez MRN: [id number] DOB: 1991-04-26 Date of Office Visit: 02/04/2024  Primary care provider: Alben Therisa MATSU, PA  Chief Complaint: Possible food allergy  History of present illness: Tina Gonzalez is a 33 y.o. female presenting today for evaluation of allergies.  Discussed the use of AI scribe software for clinical note transcription with the patient, who gave verbal consent to proceed.  She has had a sore underneath her tongue for approximately eight months, initially noticed while eating spicy food, which caused a burning sensation. The lesion sometimes becomes redder and is particularly irritated by certain foods, such as chips, that rub against it. However, no specific foods consistently exacerbate the lesion beyond mechanical irritation.  A biopsy of the lesion by an oral surgeon returned inconclusive results (see results below). A similar biopsy was performed when she was a child, also yielding inconclusive results. The oral surgeon suggested a possible allergy but did not recommend further biopsy or provide any treatment plan.  She avoids spicy foods primarily due to acid reflux but consumes a variety of other foods including dairy, eggs, wheat, soy products, nuts, fish, shellfish, meats, vegetables, and fruits without noticing any specific food-related exacerbation of the tongue lesion.  She has a history of sinus issues for which she was recommended Zyrtec and Flonase by an ENT specialist. She has seen ENT for eustachian tube dysfunction and globus sensation most recently in January 2025.  Her symptoms include throat clearing and congestion, though she uses these medications irregularly and notes only a slight improvement when she does.  No history of asthma, eczema, or childhood food allergies. No ulceration of the tongue lesion or issues with other mucosal surfaces.     Per chart review: She has had evaluation of her thyroid  with thyroid  studies  which have been normal and thyroid  ultrasound with mild enlargement of the right lobe. She has had esophagram and swallow study that were normal only indicating mild reflux. For eustachian tube dysfunction she has been recommended to use Flonase and Zyrtec.  She had a biopsy done on the oral cavity in December 2024:   Clinical History   Left floor of mouth ulcerated appearance with erythematous and leukoplakic components.  Poorly-defined borders with no history of trauma.   The specimen consists of one tan strip of soft tissue that measures 1.4 x 0.3 x 0.2 cm. The specimen is entirely submitted, uncut.  Examination reveals sections of mildly inflamed oral mucosa. The surface epithelium exhibits desquamation of the superficial layers of the parakeratin. The maturation of the epithelium is within normal limits. Minimal inflammation is seen within the lamina propria. No evidence of acantholysis or subepithelial splitting is identified.  This specimen lacks evidence of vesiculobullous disease. However, if the clinical suspicion is high for an autoimmune disorder, an additional specimen submitted in Michel's solution for direct immunofluorescent studies may yield more diagnostic information.   Review of systems: 10pt ROS negative unless noted above in HPI  Past medical history: Past Medical History:  Diagnosis Date   Iron deficiency anemia    Vitamin D deficiency     Past surgical history: Past Surgical History:  Procedure Laterality Date   TONGUE SURGERY     Biopsy    Family history:  Family History  Problem Relation Age of Onset   Eczema Mother    Allergic rhinitis Mother    Hearing loss Neg Hx     Social history: Lives in a home with carpeting with  electric heating and central cooling.  There is no concern for water damage, mildew or disorder home.  She is interested in mortgage collections.  Denies a smoking history.   Medication List: Current Outpatient Medications   Medication Sig Dispense Refill   FEROSUL 325 (65 Fe) MG tablet Take 325 mg by mouth daily.     Ferrous Sulfate (IRON PO) Take 1 tablet by mouth daily.     alum & mag hydroxide-simeth (MAALOX MAX) 400-400-40 MG/5ML suspension Take 10 mLs by mouth every 6 (six) hours as needed (throat pain, difficulty swallowing). (Patient not taking: Reported on 04/05/2023) 355 mL 0   ibuprofen  (ADVIL ,MOTRIN ) 600 MG tablet Take 1 tablet (600 mg total) by mouth every 6 (six) hours. (Patient not taking: Reported on 01/30/2022) 30 tablet 0   ondansetron  (ZOFRAN -ODT) 4 MG disintegrating tablet Take 1 tablet (4 mg total) by mouth every 8 (eight) hours as needed for nausea or vomiting. (Patient not taking: Reported on 02/04/2024) 20 tablet 0   No current facility-administered medications for this visit.    Known medication allergies: No Known Allergies   Physical examination: Blood pressure 120/78, pulse 89, temperature 98.5 F (36.9 C), temperature source Temporal, resp. rate 18, weight 136 lb 14.4 oz (62.1 kg), SpO2 100%, unknown if currently breastfeeding.  General: Alert, interactive, in no acute distress. HEENT: PERRLA, TMs pearly gray, turbinates non-edematous without discharge, post-pharynx non erythematous, left posterior tongue with a erythematous lesion with irregular border. Neck: Supple without lymphadenopathy. Lungs: Clear to auscultation without wheezing, rhonchi or rales. {no increased work of breathing. CV: Normal S1, S2 without murmurs. Abdomen: Nondistended, nontender. Skin: Warm and dry, without lesions or rashes. Extremities:  No clubbing, cyanosis or edema. Neuro:   Grossly intact.  Diagnostics/Labs: None today  Assessment and plan: Chronic tongue lesion of unclear etiology Chronic tongue lesion for 8 months, biopsy inconclusive.  Discussed IgE-mediated allergy (food/environmental) vs non-IgE-mediated contact allergy today.  No consistent food triggers identified other than spicy foods.   - Review food sheet for potential allergens for skin testing. - Schedule skin testing for IgE-mediated food and environmental allergens. Avoid antihistamines for 3 days prior to skin testing. - If IgE testing is negative, consider patch testing for contact allergies.  Patch testing is the test of choice to evaluate for contact dermatitis.  Patches are best placed on a Monday with return to office on Wednesday and Friday of same week for readings.  Once patches are in place to do not get them wet.  You can take antihistamines while patches are in place.   Rhinitis/environmental allergy Symptoms include throat clearing and nasal congestion. Inconsistent use of Zyrtec and Flonase with minimal benefit. Environmental allergy testing may guide management. - Perform environmental allergy testing as part of skin testing. - Consider more consistent use of Zyrtec and Flonase based on allergy testing results.  More effective agents may be Allegra or Xyzal as antihistamines and Nasacort, Nasonex or Rhinocort as nasal sprays.   Schedule skin testing visit (env 1-55+ select foods -see testing sheet in folder)  I appreciate the opportunity to take part in Tina Gonzalez's care. Please do not hesitate to contact me with questions.  Sincerely,   Danita Brain, MD Allergy/Immunology [facility]

## 2024-02-04 NOTE — Patient Instructions (Addendum)
 Chronic tongue lesion of unclear etiology Chronic tongue lesion for 8 months, biopsy inconclusive.  Discussed IgE-mediated allergy (food/environmental) vs non-IgE-mediated contact allergy today.  No consistent food triggers identified other than spicy foods.  - Review food sheet for potential allergens for skin testing. - Schedule skin testing for IgE-mediated food and environmental allergens. Avoid antihistamines for 3 days prior to skin testing. - If IgE testing is negative, consider patch testing for contact allergies.  Patch testing is the test of choice to evaluate for contact dermatitis.  Patches are best placed on a Monday with return to office on Wednesday and Friday of same week for readings.  Once patches are in place to do not get them wet.  You can take antihistamines while patches are in place.   Rhinitis/environmental allergy Symptoms include throat clearing and nasal congestion. Inconsistent use of Zyrtec and Flonase with minimal benefit. Environmental allergy testing may guide management. - Perform environmental allergy testing as part of skin testing. - Consider more consistent use of Zyrtec and Flonase based on allergy testing results.  More effective agents may be Allegra or Xyzal as antihistamines and Nasacort, Nasonex or Rhinocort as nasal sprays.   Schedule skin testing visit.  Avoid antihistamines for 3 days prior

## 2024-02-29 ENCOUNTER — Ambulatory Visit (HOSPITAL_BASED_OUTPATIENT_CLINIC_OR_DEPARTMENT_OTHER)
Admission: RE | Admit: 2024-02-29 | Discharge: 2024-02-29 | Disposition: A | Discharge status: Home / Self Care | Source: Ambulatory Visit | Attending: Family Medicine | Admitting: Family Medicine

## 2024-02-29 DIAGNOSIS — R09A2 Foreign body sensation, throat: Secondary | ICD-10-CM | POA: Insufficient documentation

## 2024-03-02 ENCOUNTER — Ambulatory Visit: Admitting: Allergy

## 2024-03-02 DIAGNOSIS — J309 Allergic rhinitis, unspecified: Secondary | ICD-10-CM

## 2024-03-17 ENCOUNTER — Encounter: Payer: Self-pay | Admitting: Allergy

## 2024-03-17 ENCOUNTER — Ambulatory Visit (INDEPENDENT_AMBULATORY_CARE_PROVIDER_SITE_OTHER): Admitting: Allergy

## 2024-03-17 DIAGNOSIS — T781XXD Other adverse food reactions, not elsewhere classified, subsequent encounter: Secondary | ICD-10-CM

## 2024-03-17 DIAGNOSIS — K148 Other diseases of tongue: Secondary | ICD-10-CM

## 2024-03-17 DIAGNOSIS — J310 Chronic rhinitis: Secondary | ICD-10-CM

## 2024-03-17 NOTE — Progress Notes (Signed)
 Follow-up Note  RE: Tina Gonzalez MRN: [id number] DOB: 1991-04-18 Date of Office Visit: 03/17/2024   History of present illness: Tina Gonzalez is a 33 y.o. female presenting today for skin testing visit.  She was last seen in the office on 02/04/24 for rhinitis, adverse food reaction and tongue lesion.  She is in her usual state of health today without recent illness.  She has held antihistamines for at least 3 days for testing today.   Medication List: Current Outpatient Medications  Medication Sig Dispense Refill   alum & mag hydroxide-simeth (MAALOX MAX) 400-400-40 MG/5ML suspension Take 10 mLs by mouth every 6 (six) hours as needed (throat pain, difficulty swallowing). (Patient not taking: Reported on 04/05/2023) 355 mL 0   FEROSUL 325 (65 Fe) MG tablet Take 325 mg by mouth daily.     Ferrous Sulfate (IRON PO) Take 1 tablet by mouth daily.     ibuprofen  (ADVIL ,MOTRIN ) 600 MG tablet Take 1 tablet (600 mg total) by mouth every 6 (six) hours. (Patient not taking: Reported on 01/30/2022) 30 tablet 0   ondansetron  (ZOFRAN -ODT) 4 MG disintegrating tablet Take 1 tablet (4 mg total) by mouth every 8 (eight) hours as needed for nausea or vomiting. (Patient not taking: Reported on 02/04/2024) 20 tablet 0   No current facility-administered medications for this visit.     Known medication allergies: No Known Allergies  Diagnostics/Labs:  Allergy testing:   Airborne Adult Perc - 03/17/24 1515     Time Antigen Placed 1515    Allergen Manufacturer Jestine    Location Back    Number of Test 55    1. Control-Buffer 50% Glycerol Negative    2. Control-Histamine 2+    3. Bahia Negative    4. French Southern Territ[place] Negative    5. Johnson Negative    6. [place]  Blue Negative    7. Meadow Fescue Negative    8. Perennial Rye Negative    9. Timothy Negative    10. Ragweed Mix Negative    11. Cocklebur Negative    12. Plantain,  English Negative    13. Baccharis Negative    14. Dog Fennel Negative    15.  Russian Thistle Negative    16. Lamb's Quarters Negative    17. Sheep Sorrell Negative    18. Rough Pigweed Negative    19. Marsh Elder, Rough Negative    20. Mugwort, Common Negative    21. Box, Elder Negative    22. Cedar, red Negative    23. Sweet Gum Negative    24. Pecan Pollen Negative    25. Pine Mix Negative    26. Walnut, Black Pollen Negative    27. Red Mulberry Negative    28. Ash Mix Negative    29. Birch Mix 2+    30. Beech American Negative    31. Cottonwood, [place] Negative    32. Hickory, White Negative    33. Maple Mix Negative    34. Oak, [place] Mix Negative    35. Sycamore Eastern Negative    36. Alternaria Alternata Negative    37. Cladosporium Herbarum Negative    38. Aspergillus Mix Negative    39. Penicillium Mix Negative    40. Bipolaris Sorokiniana (Helminthosporium) Negative    41. Drechslera Spicifera (Curvularia) Negative    42. Mucor Plumbeus Negative    43. Fusarium Moniliforme Negative    44. Aureobasidium Pullulans (pullulara) Negative    45. Rhizopus Oryzae Negative    46. Botrytis  Cinera Negative    47. Epicoccum Nigrum Negative    48. Phoma Betae Negative    49. Dust Mite Mix Negative    50. Cat Hair 10,000 BAU/ml Negative    51.  Dog Epithelia 2+    52. Mixed Feathers Negative    53. Horse Epithelia Negative    54. Cockroach, German 2+    55. Tobacco Leaf Negative          Food Adult Perc - 03/17/24 1500     Time Antigen Placed 1515    Allergen Manufacturer Jestine    Location Back    1. Peanut Negative    2. Soybean Negative    3. Wheat Negative    4. Sesame Negative    5. Milk, Cow Negative    6. Casein Negative    7. Egg White, Chicken Negative    8. Shellfish Mix Negative    9. Fish Mix Negative    10. Cashew Negative    11. Walnut Food Negative    12. Almond Negative    13. Hazelnut Negative    14. Pecan Food Negative    15. Pistachio Negative    16. [place] Nut Negative    17. Coconut Negative    18. Trout  Negative    19. Tuna Negative    20. Salmon Negative    21. Flounder Negative    22. Codfish Omitted    23. Shrimp Negative    24. Crab Negative    25. Lobster Negative    26. Oyster Omitted    27. Scallops Omitted    28. Oat  Negative    29. Rice Negative    30. Barley Negative    31. Rye  Negative    32. Hops Omitted    33. [place] Meat Negative    34. Chicken Meat Negative    35. Pork Omitted    36. Beef Negative    37. Lamb Negative    38. Tomato Negative    39. White Potato Negative    40. Sweet Potato Negative    41. Pea, Green/English Omitted    42. Navy Bean Omitted    43. Green Beans Negative    44. Squash Omitted    45. Green Pepper Negative    46. Mushrooms Omitted    47. Onion Negative    48. Avocado Negative    49. Cabbage Omitted    50. Carrots Negative    51. Celery Negative    52. Corn Omitted    53. Cucumber Negative    54. Grape (White seedless) Negative    55. Orange  Negative    56. Lemon Negative    57. Banana Negative    58. Apple Negative    59. Peach Omitted    60. Strawberry Negative    61. Blueberry Negative    62. Cherry Negative    63. Cantaloupe Omitted    64. Watermelon Negative    65. Pineapple Negative    66. Chocolate/Cacao Bean Negative    67. Cinnamon Negative    68. Nutmeg Negative    69. Ginger Negative    70. Garlic Negative    71. Pepper, Black Negative    72. Mustard Negative    Comments n          Allergy testing results were read and interpreted by provider, documented by clinical staff.   Assessment and plan: Chronic tongue lesion of unclear etiology Chronic tongue lesion  for 8 months, biopsy inconclusive.  Discussed IgE-mediated allergy (food/environmental) vs non-IgE-mediated contact allergy today.  No consistent food triggers identified other than spicy foods.  - Food allergy testing is negative - With negative food testing would consider patch testing for contact allergies.  Patch testing is the test of  choice to evaluate for contact dermatitis.  Patches are best placed on a Monday with return to office on Wednesday and Friday of same week for readings.  Once patches are in place to do not get them wet.  You can take antihistamines while patches are in place.   Rhinitis/environmental allergy Symptoms include throat clearing and nasal congestion. Inconsistent use of Zyrtec and Flonase with minimal benefit.  - Use Zyrtec 10mg  daily as needed.  More effective agents may be Allegra or Xyzal as antihistamines - Use Flonase 2 sprays each nostril for 1-2 weeks at a time before stopping once nasal congestion improves for maximum benefit.   More effective agents may be Nasacort, Nasonex or Rhinocort as nasal sprays.  - Testing today showed: trees, dog, and cockroach - Copy of test results provided.  - Avoidance measures provided. - Consider allergy shots as a [place] of long-term control. - Allergy shots re-train and reset the immune system to ignore environmental allergens and decrease the resulting immune response to those allergens (sneezing, itchy watery eyes, runny nose, nasal congestion, etc).    - Allergy shots improve symptoms in 75-85% of patients.  - We can discuss more at a future appointment if the medications are not working for you.  Follow-up in 4-6 months or sooner if needed  I appreciate the opportunity to take part in Balinda's care. Please do not hesitate to contact me with questions.  Sincerely,   Danita Brain, MD Allergy/Immunology [facility]

## 2024-03-17 NOTE — Patient Instructions (Signed)
 Chronic tongue lesion of unclear etiology Chronic tongue lesion for 8 months, biopsy inconclusive.  Discussed IgE-mediated allergy (food/environmental) vs non-IgE-mediated contact allergy today.  No consistent food triggers identified other than spicy foods.  - Food allergy testing is negative - With negative food testing would consider patch testing for contact allergies.  Patch testing is the test of choice to evaluate for contact dermatitis.  Patches are best placed on a Monday with return to office on Wednesday and Friday of same week for readings.  Once patches are in place to do not get them wet.  You can take antihistamines while patches are in place.   Rhinitis/environmental allergy Symptoms include throat clearing and nasal congestion. Inconsistent use of Zyrtec and Flonase with minimal benefit.  - Use Zyrtec 10mg  daily as needed.  More effective agents may be Allegra or Xyzal as antihistamines - Use Flonase 2 sprays each nostril for 1-2 weeks at a time before stopping once nasal congestion improves for maximum benefit.   More effective agents may be Nasacort, Nasonex or Rhinocort as nasal sprays.  - Testing today showed: trees, dog, and cockroach - Copy of test results provided.  - Avoidance measures provided. - Consider allergy shots as a [place] of long-term control. - Allergy shots re-train and reset the immune system to ignore environmental allergens and decrease the resulting immune response to those allergens (sneezing, itchy watery eyes, runny nose, nasal congestion, etc).    - Allergy shots improve symptoms in 75-85% of patients.  - We can discuss more at a future appointment if the medications are not working for you.  Follow-up in 4-6 months or sooner if needed

## 2024-03-22 ENCOUNTER — Ambulatory Visit: Admitting: Allergy

## 2024-06-21 DIAGNOSIS — E538 Deficiency of other specified B group vitamins: Secondary | ICD-10-CM | POA: Diagnosis not present

## 2024-06-21 DIAGNOSIS — R09A2 Foreign body sensation, throat: Secondary | ICD-10-CM | POA: Diagnosis not present

## 2024-06-21 DIAGNOSIS — E611 Iron deficiency: Secondary | ICD-10-CM | POA: Diagnosis not present

## 2024-06-21 DIAGNOSIS — K148 Other diseases of tongue: Secondary | ICD-10-CM | POA: Diagnosis not present

## 2024-06-29 ENCOUNTER — Ambulatory Visit (HOSPITAL_COMMUNITY): Payer: Self-pay

## 2024-06-29 ENCOUNTER — Encounter (HOSPITAL_COMMUNITY): Payer: Self-pay

## 2024-06-29 ENCOUNTER — Ambulatory Visit (HOSPITAL_COMMUNITY)
Admission: RE | Admit: 2024-06-29 | Discharge: 2024-06-29 | Disposition: A | Discharge status: Home / Self Care | Payer: Self-pay | Source: Ambulatory Visit

## 2024-06-29 VITALS — BP 102/67 | HR 94 | Temp 98.1°F | Resp 16

## 2024-06-29 DIAGNOSIS — S6991XD Unspecified injury of right wrist, hand and finger(s), subsequent encounter: Secondary | ICD-10-CM

## 2024-06-29 NOTE — ED Provider Notes (Signed)
 " [facility]    CSN: [id number] Arrival date & time: 06/29/24  1552      History   Chief Complaint Chief Complaint  Patient presents with   Finger Injury   Appointment    HPI Tina Gonzalez is a 34 y.o. female.   This 34 year old female is being seen for complaints of right ring finger pain.  She says approximately 4 weeks ago she was walking her dog and her finger got caught in his chain.  She had significant pain and swelling directly after.  She had an appoint with her primary care provider who offered an x-ray, but felt like her finger was okay.  She declined x-ray at that time.  She reports swelling has improved, but she still has pain when touching her finger or bending it in certain ways.  She says it is not painful all the time, but when she touches it a certain way or bends it a certain way she will get pain.  She has not taken anything for pain in the past week.  She denies numbness tingling, weakness.  She denies chest pain, shortness of breath.  She denies any other injury other than her finger.     Past Medical History:  Diagnosis Date   Iron deficiency anemia    Vitamin D deficiency     Patient Active Problem List   Diagnosis Date Noted   Allergic rhinitis 06/03/2023   SVD (spontaneous vaginal delivery) 09/25/2015   Oligohydramnios antepartum 09/24/2015   Hyperemesis gravidarum before end of [redacted] week gestation, dehydration 05/09/2015    Past Surgical History:  Procedure Laterality Date   TONGUE SURGERY     Biopsy    OB History     Gravida  2   Para  2   Term  2   Preterm  0   AB  0   Living  2      SAB  0   IAB  0   Ectopic  0   Multiple      Live Births  2            Home Medications    Prior to Admission medications  Medication Sig Start Date End Date Taking? Authorizing Provider  alum & mag hydroxide-simeth (MAALOX MAX) 400-400-40 MG/5ML suspension Take 10 mLs by mouth every 6 (six) hours as needed (throat pain,  difficulty swallowing). Patient not taking: Reported on 04/05/2023 01/30/22   Elnor Hila P, DO  FEROSUL 325 (65 Fe) MG tablet Take 325 mg by mouth daily.    [provider]  Ferrous Sulfate (IRON PO) Take 1 tablet by mouth daily.    [provider]    Family History Family History  Problem Relation Age of Onset   Eczema Mother    Allergic rhinitis Mother    Hearing loss Neg Hx     Social History Social History[1]   Allergies   Patient has no known allergies.   Review of Systems Review of Systems  Constitutional:  Negative for activity change, chills and fever.  Respiratory:  Negative for shortness of breath.   Cardiovascular:  Negative for chest pain.  Musculoskeletal:  Positive for arthralgias.  Skin:  Negative for color change.  Neurological:  Negative for weakness and numbness.  All other systems reviewed and are negative.    Physical Exam Triage Vital Signs ED Triage Vitals  Encounter Vitals Group     BP 06/29/24 1617 102/67     Girls  Systolic BP Percentile --      Girls Diastolic BP Percentile --      Boys Systolic BP Percentile --      Boys Diastolic BP Percentile --      Pulse Rate 06/29/24 1617 94     Resp 06/29/24 1617 16     Temp 06/29/24 1617 98.1 F (36.7 C)     Temp Source 06/29/24 1617 Oral     SpO2 06/29/24 1617 98 %     Weight --      Height --      Head Circumference --      Peak Flow --      Pain Score 06/29/24 1615 5     Pain Loc --      Pain Education --      Exclude from Growth Chart --    No data found.  Updated Vital Signs BP 102/67 (BP Location: Right Arm)   Pulse 94   Temp 98.1 F (36.7 C) (Oral)   Resp 16   LMP 06/08/2024 (Approximate)   SpO2 98%   Breastfeeding No   Visual Acuity Right Eye Distance:   Left Eye Distance:   Bilateral Distance:    Right Eye Near:   Left Eye Near:    Bilateral Near:     Physical Exam Vitals and nursing note reviewed.  Constitutional:      General: She is not in  acute distress.    Appearance: She is well-developed.     Comments: Pleasant female appearing stated age found sitting in chair in no acute distress.  HENT:     Head: Normocephalic and atraumatic.  Eyes:     Conjunctiva/sclera: Conjunctivae normal.  Cardiovascular:     Rate and Rhythm: Normal rate and regular rhythm.     Heart sounds: Normal heart sounds. No murmur heard. Pulmonary:     Effort: Pulmonary effort is normal. No respiratory distress.     Breath sounds: Normal breath sounds.  Abdominal:     Palpations: Abdomen is soft.     Tenderness: There is no abdominal tenderness.  Musculoskeletal:     Right hand: Bony tenderness present. No swelling. Normal range of motion. Normal strength. Normal sensation. Normal capillary refill.     Comments: Right hand fourth digit.  Skin:    General: Skin is warm and dry.     Capillary Refill: Capillary refill takes less than 2 seconds.  Neurological:     Mental Status: She is alert.  Psychiatric:        Mood and Affect: Mood normal.      UC Treatments / Results  Labs (all labs ordered are listed, but only abnormal results are displayed) Labs Reviewed - No data to display  EKG   Radiology DG Finger Ring Right Result Date: 06/29/2024 CLINICAL DATA:  Swelling, reduced range of motion after injury. EXAM: RIGHT RING FINGER 2+V COMPARISON:  None Available. FINDINGS: There is no evidence of fracture or dislocation. The alignment and joint spaces are normal. There is no evidence of arthropathy or other focal bone abnormality. Soft tissues are unremarkable. IMPRESSION: Negative radiographs of the right ring finger. Electronically Signed   By: Andrea Gasman M.D.   On: 06/29/2024 17:18    Procedures Procedures (including critical care time)  Medications Ordered in UC Medications - No data to display  Initial Impression / Assessment and Plan / UC Course  I have reviewed the triage vital signs and the nursing notes.  Pertinent labs &  imaging results that were available during my care of the patient were reviewed by me and considered in my medical decision making (see chart for details).     Vitals and triage reviewed, patient is hemodynamically stable.  Right ring finger x-ray obtained and is negative.  Suspect finger sprain since patient continues with pain.  She is placed in a finger splint.  Orthopedic follow-up information given.  She is advised supportive care with Tylenol  and/or ibuprofen , ice, elevation.  Plan of care, follow-up care, return precautions given, no questions at this time.  Work note provided. Final Clinical Impressions(s) / UC Diagnoses   Final diagnoses:  None   Discharge Instructions   None    ED Prescriptions   None    PDMP not reviewed this encounter.    [1]  Social History Tobacco Use   Smoking status: Never   Smokeless tobacco: Never  Vaping Use   Vaping status: Never Used  Substance Use Topics   Alcohol use: No    Comment: social   Drug use: No     Lennice Jon BROCKS, FNP 06/29/24 1731  "

## 2024-06-29 NOTE — ED Triage Notes (Signed)
 Needing X-ray for finger pain that's lasted a few weeks since my finger was caught in chain - Entered by patient.  Patient states her finger was caught in her dog's chain when her dog pulled her. States the finger got bent and swollen. States the swelling is better but the pain is ongoing. Injury to the right ring finger.   Patient has not had any meds for pain this past week.

## 2024-06-29 NOTE — Discharge Instructions (Addendum)
 Your x-rays of your finger were negative for fracture or dislocation.   Wear the finger splint we provided in the clinic for the next couple of weeks to provide stability, and comfort.  Please rest, ice, and elevate your injury to help it heal and decrease inflammation.   Take 600mg  ibuprofen  and/or 1,000mg  tylenol  every 6 hours as needed for pain and inflammation. Take with food to avoid stomach upset.  Call the orthopedic provider listed on your discharge paperwork to schedule a follow-up appointment if your symptoms do not improve in the next 1-2 weeks with supportive care.  [facility] [address] [place], [place]  [address] [phone]  Return to urgent care if you experience worsening pain, numbness, tingling, change of color in your skin near the injury, or any other concerning symptoms.  I hope you feel better!
